# Patient Record
Sex: Male | Born: 1955
Health system: Southern US, Community
[De-identification: ages and names within clinical notes are randomized; demographics above are authoritative.]

## PROBLEM LIST (undated history)

## (undated) DIAGNOSIS — E785 Hyperlipidemia, unspecified: Secondary | ICD-10-CM

## (undated) DIAGNOSIS — E119 Type 2 diabetes mellitus without complications: Secondary | ICD-10-CM

## (undated) DIAGNOSIS — H269 Unspecified cataract: Secondary | ICD-10-CM

## (undated) DIAGNOSIS — F419 Anxiety disorder, unspecified: Secondary | ICD-10-CM

## (undated) DIAGNOSIS — M1990 Unspecified osteoarthritis, unspecified site: Secondary | ICD-10-CM

## (undated) DIAGNOSIS — I10 Essential (primary) hypertension: Secondary | ICD-10-CM

## (undated) HISTORY — DX: Unspecified cataract: H26.9

## (undated) HISTORY — DX: Hyperlipidemia, unspecified: E78.5

## (undated) HISTORY — PX: POLYPECTOMY: SHX149

## (undated) HISTORY — DX: Type 2 diabetes mellitus without complications: E11.9

## (undated) HISTORY — DX: Anxiety disorder, unspecified: F41.9

## (undated) HISTORY — DX: Essential (primary) hypertension: I10

## (undated) HISTORY — DX: Unspecified osteoarthritis, unspecified site: M19.90

## (undated) HISTORY — PX: TONSILLECTOMY: SUR1361

---

## 1999-01-07 ENCOUNTER — Other Ambulatory Visit: Admission: RE | Admit: 1999-01-07 | Discharge: 1999-01-07 | Payer: Self-pay | Admitting: Family Medicine

## 2006-11-05 HISTORY — PX: COLONOSCOPY: SHX174

## 2011-12-23 ENCOUNTER — Ambulatory Visit (AMBULATORY_SURGERY_CENTER): Payer: Self-pay | Admitting: *Deleted

## 2011-12-23 ENCOUNTER — Encounter: Payer: Self-pay | Admitting: Internal Medicine

## 2011-12-23 VITALS — Ht 73.0 in | Wt 257.5 lb

## 2011-12-23 DIAGNOSIS — Z8371 Family history of colonic polyps: Secondary | ICD-10-CM

## 2011-12-23 DIAGNOSIS — Z1211 Encounter for screening for malignant neoplasm of colon: Secondary | ICD-10-CM

## 2011-12-23 DIAGNOSIS — Z83719 Family history of colon polyps, unspecified: Secondary | ICD-10-CM

## 2011-12-23 MED ORDER — MOVIPREP 100 G PO SOLR: ORAL | Status: DC

## 2012-01-06 ENCOUNTER — Encounter: Payer: Self-pay | Admitting: Internal Medicine

## 2012-01-06 ENCOUNTER — Ambulatory Visit (AMBULATORY_SURGERY_CENTER): Payer: 59 | Admitting: Internal Medicine

## 2012-01-06 VITALS — BP 127/75 | HR 57 | Temp 97.2°F | Resp 11 | Ht 73.0 in | Wt 257.0 lb

## 2012-01-06 DIAGNOSIS — Z8371 Family history of colonic polyps: Secondary | ICD-10-CM

## 2012-01-06 DIAGNOSIS — Z83719 Family history of colon polyps, unspecified: Secondary | ICD-10-CM

## 2012-01-06 DIAGNOSIS — D126 Benign neoplasm of colon, unspecified: Secondary | ICD-10-CM

## 2012-01-06 DIAGNOSIS — Z1211 Encounter for screening for malignant neoplasm of colon: Secondary | ICD-10-CM

## 2012-01-06 MED ORDER — SODIUM CHLORIDE 0.9 % IV SOLN: 500.0000 mL | INTRAVENOUS | Status: DC

## 2012-01-06 NOTE — Patient Instructions (Addendum)
Discharge instructions given with verbal understanding. Handouts on polyps,diverticulosis and hemorrhoids given. Resume previous medications. YOU HAD AN ENDOSCOPIC PROCEDURE TODAY AT THE [facility]: Refer to the procedure report that was given to you for any specific questions about what was found during the examination.  If the procedure report does not answer your questions, please call your gastroenterologist to clarify.  If you requested that your care partner not be given the details of your procedure findings, then the procedure report has been included in a sealed envelope for you to review at your convenience later.  YOU SHOULD EXPECT: Some feelings of bloating in the abdomen. Passage of more gas than usual.  Walking can help get rid of the air that was put into your GI tract during the procedure and reduce the bloating. If you had a lower endoscopy (such as a colonoscopy or flexible sigmoidoscopy) you may notice spotting of blood in your stool or on the toilet paper. If you underwent a bowel prep for your procedure, then you may not have a normal bowel movement for a few days.  DIET: Your first meal following the procedure should be a light meal and then it is ok to progress to your normal diet.  A half-sandwich or bowl of soup is an example of a good first meal.  Heavy or fried foods are harder to digest and may make you feel nauseous or bloated.  Likewise meals heavy in dairy and vegetables can cause extra gas to form and this can also increase the bloating.  Drink plenty of fluids but you should avoid alcoholic beverages for 24 hours.  ACTIVITY: Your care partner should take you home directly after the procedure.  You should plan to take it easy, moving slowly for the rest of the day.  You can resume normal activity the day after the procedure however you should NOT DRIVE or use heavy machinery for 24 hours (because of the sedation medicines used during the test).    SYMPTOMS TO  REPORT IMMEDIATELY: A gastroenterologist can be reached at any hour.  During normal business hours, 8:30 AM to 5:00 PM Monday through Friday, call [phone].  After hours and on weekends, please call the GI answering service at [phone] who will take a message and have the physician on call contact you.   Following lower endoscopy (colonoscopy or flexible sigmoidoscopy):  Excessive amounts of blood in the stool  Significant tenderness or worsening of abdominal pains  Swelling of the abdomen that is new, acute  Fever of 100F or higher  FOLLOW UP: If any biopsies were taken you will be contacted by phone or by letter within the next 1-3 weeks.  Call your gastroenterologist if you have not heard about the biopsies in 3 weeks.  Our staff will call the home number listed on your records the next business day following your procedure to check on you and address any questions or concerns that you may have at that time regarding the information given to you following your procedure. This is a courtesy call and so if there is no answer at the home number and we have not heard from you through the emergency physician on call, we will assume that you have returned to your regular daily activities without incident.  SIGNATURES/CONFIDENTIALITY: You and/or your care partner have signed paperwork which will be entered into your electronic medical record.  These signatures attest to the fact that that the information above on your After Visit   Summary has been reviewed and is understood.  Full responsibility of the confidentiality of this discharge information lies with you and/or your care-partner.  

## 2012-01-06 NOTE — Progress Notes (Signed)
Patient did not experience any of the following events: a burn prior to discharge; a fall within the facility; wrong site/side/patient/procedure/implant event; or a hospital transfer or hospital admission upon discharge from the facility. (G8907) Patient did not have preoperative order for IV antibiotic SSI prophylaxis. (G8918)  

## 2012-01-06 NOTE — Progress Notes (Signed)
The pt tolerated the colonoscopy very well. Maw   

## 2012-01-06 NOTE — Progress Notes (Signed)
Propofol given over incremental dosages 

## 2012-01-06 NOTE — Op Note (Signed)
[facility] [address] [place], [address]   COLONOSCOPY PROCEDURE REPORT  PATIENT: Miguel Estrada, Miguel Estrada  MR#: [id number] BIRTHDATE: 21-Jul-1955 , 56  yrs. old GENDER: Male ENDOSCOPIST: Beverley Fiedler, MD REFERRED [id number] Tiburcio Pea, MD PROCEDURE DATE:  01/06/2012 PROCEDURE:   Colonoscopy with snare polypectomy ASA CLASS:   Class II INDICATIONS:elevated risk screening, Patient's family history of colon polyps, and Last colonoscopy performed 5 years ago. MEDICATIONS: MAC sedation, administered by CRNA and propofol (Diprivan) 300mg  IV  DESCRIPTION OF PROCEDURE:   After the risks benefits and alternatives of the procedure were thoroughly explained, informed consent was obtained.  A digital rectal exam revealed no rectal mass.   The LB CF-[id number]  endoscope was introduced through the anus and advanced to the terminal ileum which was intubated for a short distance. No adverse events experienced.   The quality of the prep was good, using MoviPrep  The instrument was then slowly withdrawn as the colon was fully examined.  COLON FINDINGS: The mucosa appeared normal in the terminal ileum. Three sessile polyps ranging between 3-29mm in size were found in the ascending colon and descending colon.  Polypectomy was performed using cold snare.  All resections were complete and all polyp tissue was completely retrieved.   Mild diverticulosis was noted in the sigmoid colon.  Retroflexed views revealed small internal hemorrhoids. The time to cecum=2 minutes 42 seconds. Withdrawal time=15 minutes 09 seconds.  The scope was withdrawn and the procedure completed. COMPLICATIONS: There were no complications.  ENDOSCOPIC IMPRESSION: 1.   Normal mucosa in the terminal ileum 2.   Three sessile polyps ranging between 3-70mm in size were found in the ascending colon and descending colon; Polypectomy was performed using cold snare 3.   Mild diverticulosis was noted in the sigmoid  colon 4.   Small internal hemorrhoids  RECOMMENDATIONS: 1.  Await pathology results 2.  High fiber diet 3.  Timing of repeat colonoscopy will be determined by pathology findings. 4.  You will receive a letter within 1-2 weeks with the results of your biopsy as well as final recommendations.  Please call my office if you have not received a letter after 3 weeks.   eSigned:  Beverley Fiedler, MD 01/06/2012 9:41 AM  cc: Johny Blamer MD and The Patient

## 2012-01-07 ENCOUNTER — Telehealth: Payer: Self-pay | Admitting: *Deleted

## 2012-01-07 NOTE — Telephone Encounter (Signed)
  Follow up Call-  Call back number 01/06/2012  Post procedure Call Back phone  # [phone]  Permission to leave phone message Yes     Patient questions:  Do you have a fever, pain , or abdominal swelling? no Pain Score  0 *  Have you tolerated food without any problems? yes  Have you been able to return to your normal activities? yes  Do you have any questions about your discharge instructions: Diet   no Medications  no Follow up visit  no  Do you have questions or concerns about your Care? no  Actions: * If pain score is 4 or above: No action needed, pain <4.

## 2012-01-11 ENCOUNTER — Encounter: Payer: Self-pay | Admitting: Internal Medicine

## 2012-09-03 ENCOUNTER — Ambulatory Visit: Payer: 59

## 2012-09-24 ENCOUNTER — Ambulatory Visit: Payer: 59

## 2012-10-11 ENCOUNTER — Encounter: Payer: 59 | Attending: Family Medicine

## 2012-10-11 VITALS — Ht 73.0 in | Wt 240.4 lb

## 2012-10-11 DIAGNOSIS — E119 Type 2 diabetes mellitus without complications: Secondary | ICD-10-CM | POA: Insufficient documentation

## 2012-10-11 DIAGNOSIS — Z713 Dietary counseling and surveillance: Secondary | ICD-10-CM | POA: Insufficient documentation

## 2012-10-15 NOTE — Progress Notes (Signed)
Patient was seen on 10/11/12 for the first of a series of three diabetes self-management courses at the [facility].   Current HbA1c: 6.4%  The following learning objectives were met by the patient during this course:   Defines the role of glucose and insulin  Identifies type of diabetes and pathophysiology  Defines the diagnostic criteria for diabetes and prediabetes  States the risk factors for Type 2 Diabetes  States the symptoms of Type 2 Diabetes  Defines Type 2 Diabetes treatment goals  Defines Type 2 Diabetes treatment options  States the rationale for glucose monitoring  Identifies A1C, glucose targets, and testing times  Identifies proper sharps disposal  Defines the purpose of a diabetes food plan  Identifies carbohydrate food groups  Defines effects of carbohydrate foods on glucose levels  Identifies carbohydrate choices/grams/food labels  States benefits of physical activity and effect on glucose  Review of suggested activity guidelines  Handouts given during class include:  Type 2 Diabetes: Basics Book  My Food Plan Book  Food and Activity Log  Your patient has identified their diabetes self-care support plan as:  [facility] support group  Continued diabetes education  Follow-Up Plan: Attend core 2 and core 3

## 2012-10-15 NOTE — Patient Instructions (Signed)
Goals:  Follow Diabetes Meal Plan as instructed  Eat 3 meals and 2 snacks, every 3-5 hrs  Limit carbohydrate intake to 30-45 grams carbohydrate/meal  Limit carbohydrate intake to 0-15 grams carbohydrate/snack  Add lean protein foods to meals/snacks  Monitor glucose levels as instructed by your doctor  Aim for 15-30 mins of physical activity daily  Bring food record and glucose log to your next nutrition visit   

## 2012-10-22 ENCOUNTER — Ambulatory Visit: Payer: 59

## 2012-11-24 ENCOUNTER — Other Ambulatory Visit: Payer: Self-pay | Admitting: Family Medicine

## 2012-11-29 ENCOUNTER — Encounter: Payer: 59 | Attending: Family Medicine

## 2012-11-29 DIAGNOSIS — Z713 Dietary counseling and surveillance: Secondary | ICD-10-CM | POA: Insufficient documentation

## 2012-11-29 DIAGNOSIS — E119 Type 2 diabetes mellitus without complications: Secondary | ICD-10-CM | POA: Insufficient documentation

## 2012-12-27 ENCOUNTER — Encounter: Payer: 59 | Attending: Family Medicine

## 2012-12-27 DIAGNOSIS — Z713 Dietary counseling and surveillance: Secondary | ICD-10-CM | POA: Insufficient documentation

## 2012-12-27 DIAGNOSIS — E119 Type 2 diabetes mellitus without complications: Secondary | ICD-10-CM

## 2012-12-28 NOTE — Progress Notes (Signed)
Patient was seen on 12/27/12 for the third of a series of three diabetes self-management courses at the [facility]. The following learning objectives were met by the patient during this class:    State the amount of activity recommended for healthy living   Describe activities suitable for individual needs   Identify ways to regularly incorporate activity into daily life   Identify barriers to activity and ways to over come these barriers  Identify diabetes medications being personally used and their primary action for lowering glucose and possible side effects   Describe role of stress on blood glucose and develop strategies to address psychosocial issues   Identify diabetes complications and ways to prevent them  Explain how to manage diabetes during illness   Evaluate success in meeting personal goal   Establish 2-3 goals that they will plan to diligently work on until they return for the free 16-month follow-up visit  Your patient has established the following 4 month goals in their individualized success plan: I will increase my activity level at least 3 days a week for 30 minutes or more I will take my diabetes medications as scheduled To help manage stress I will do walking at least 3 times a week  Your patient has identified these potential barriers to change:  There are no potential barriers, I will accomplish this goal and manage my diabetes successfully  Your patient has identified their diabetes self-care support plan as  My family, education

## 2013-04-12 ENCOUNTER — Encounter: Payer: Self-pay | Admitting: Neurology

## 2013-04-12 ENCOUNTER — Ambulatory Visit (INDEPENDENT_AMBULATORY_CARE_PROVIDER_SITE_OTHER): Payer: 59 | Admitting: Neurology

## 2013-04-12 VITALS — BP 140/80 | HR 96 | Resp 18 | Ht 72.0 in | Wt 243.0 lb

## 2013-04-12 DIAGNOSIS — R42 Dizziness and giddiness: Secondary | ICD-10-CM

## 2013-04-12 DIAGNOSIS — R200 Anesthesia of skin: Secondary | ICD-10-CM

## 2013-04-12 DIAGNOSIS — R209 Unspecified disturbances of skin sensation: Secondary | ICD-10-CM

## 2013-04-12 NOTE — Patient Instructions (Addendum)
1.  We will check an MRI of the brain to look for anything unusual.  It probably is due to inner ear dysfunction, however.  We will contact you with the results.  If there is anything on the MRI, we will contact you and schedule follow up with me. We have scheduled you at [facility] for your MRI on 04/22/2013 at 10:00 am. Please arrive 30 minutes prior and go to [place].

## 2013-04-12 NOTE — Progress Notes (Addendum)
NEUROLOGY CONSULTATION NOTE  Miguel Estrada MRN: [id number] DOB: [birth year]  Referring provider: Dr. Moreen Fowler Primary care provider: Dr. Kenton Kingfisher  Reason for consult:  Dysequilibrium  HISTORY OF PRESENT ILLNESS: Miguel Estrada is a 58 year old right-handed man with diabetes mellitus type II, hypertension, hyperlipidemia,  BPH and anxiety who presents for dizziness.  Records and images were personally reviewed where available.    He has had symptoms for 10 years, but thinks it has been getting worse over the past few months.  He describes feeling of swaying to either side while he walks.  This doesn't just happen when he first gets up.  He has stumbled at times but hasn't fallen.  It is not associated with any spinning sensation or double vision.  There is no associated unilateral weakness, slurred speech, headache, unilateral numbness.  He denies hearing loss or tinnitus.  He reports that meclizine exacerbated the symptoms.  He also has episodes of lightheadedness, particularly when he gets up.  Sometimes he feels like he is going to pass out.  He saw audiology in 2009, who told him that he had a vestibulopathy, perhaps an inner ear congestion, worse on the right side.  He was taught to perform vestibular rehab therapy, but he apparently was not performing it correctly and he subsequently stopped.  He reportedly saw ENT (Dr. Lucia Gaskins) last year, who thought it was an inner ear problem and recommended vestibular rehab, which was not pursued.  Unfortunately, I do not have the ENT's notes so I do not have the details of testing that was performed.  More recently, he complains of bilateral numbness and tingling of the face, associated with flushing of his cheeks.  This sensation has become more constant.  He also feels that he is dropping things more frequently.  He also endorses a muscle cramp in his right hamstring.  He does have history of anxiety.  PAST MEDICAL HISTORY: Past Medical History  Diagnosis  Date  . Cataract     left eye  . Hyperlipidemia   . Hypertension   . Diabetes mellitus without complication   . Anxiety   . Arthritis     neck    PAST SURGICAL HISTORY: Past Surgical History  Procedure Laterality Date  . Tonsillectomy  age 52  . Colonoscopy  11-05-2006    Dr.Buccini-normal colon    MEDICATIONS: Current Outpatient Prescriptions on File Prior to Visit  Medication Sig Dispense Refill  . BABY ASPIRIN PO Take 81 mg by mouth daily.      Marland Kitchen LORazepam (ATIVAN) 0.5 MG tablet Take 0.5 mg by mouth as needed for anxiety.      Marland Kitchen losartan (COZAAR) 50 MG tablet Take 50 mg by mouth daily.      . metFORMIN (GLUCOPHAGE) 1000 MG tablet Take 1,000 mg by mouth daily with breakfast.      . pravastatin (PRAVACHOL) 80 MG tablet Take 80 mg by mouth daily.       No current facility-administered medications on file prior to visit.    ALLERGIES: Allergies  Allergen Reactions  . Penicillins Other (See Comments)    As child=reaction unknown    FAMILY HISTORY: Family History  Problem Relation Age of Onset  . Colon polyps Brother   . Colon cancer Neg Hx     SOCIAL HISTORY: History   Social History  . Marital Status: Unknown    Spouse Name: N/A    Number of Children: N/A  . Years of Education: N/A  Occupational History  . Not on file.   Social History Main Topics  . Smoking status: Former Research scientist (life sciences)  . Smokeless tobacco: Never Used     Comment: quit smoking 30 years ago  . Alcohol Use: 1.2 oz/week    2 Cans of beer per week  . Drug Use: No  . Sexual Activity: Not on file   Other Topics Concern  . Not on file   Social History Narrative  . No narrative on file    REVIEW OF SYSTEMS: Constitutional: No fevers, chills, or sweats, no generalized fatigue, change in appetite Eyes: No visual changes, double vision, eye pain Ear, nose and throat: As above. Cardiovascular: No chest pain, palpitations Respiratory:  No shortness of breath at rest or with exertion,  wheezes GastrointestinaI: No nausea, vomiting, diarrhea, abdominal pain, fecal incontinence Genitourinary:  No dysuria, urinary retention or frequency Musculoskeletal:  Right hamstring pain. Integumentary: No rash, pruritus, skin lesions Neurological: as above Psychiatric: No depression, insomnia, anxiety Endocrine: No palpitations, fatigue, diaphoresis, mood swings, change in appetite, change in weight, increased thirst Hematologic/Lymphatic:  No anemia, purpura, petechiae. Allergic/Immunologic: no itchy/runny eyes, nasal congestion, recent allergic reactions, rashes  PHYSICAL EXAM: Filed Vitals:   04/12/13 1454  BP: 140/80  Pulse: 96  Resp: 18   General: No acute distress Head:  Normocephalic/atraumatic Neck: supple, no paraspinal tenderness, full range of motion Back: No paraspinal tenderness Heart: regular rate and rhythm Lungs: Clear to auscultation bilaterally. Vascular: No carotid bruits. Neurological Exam: Mental status: alert and oriented to person, place, and time, speech fluent and not dysarthric, language intact. Cranial nerves: CN I: not tested CN II: pupils equal, round and reactive to light, visual fields intact, fundi unremarkable. CN III, IV, VI:  full range of motion, mild right horizontal nystagmus, no ptosis CN V: facial sensation intact CN VII: upper and lower face symmetric CN VIII: hearing intact CN IX, X: gag intact, uvula midline CN XI: sternocleidomastoid and trapezius muscles intact CN XII: tongue midline Bulk & Tone: normal, no fasciculations. Motor: 5/5 throughout Sensation: temperature and vibration intact. Deep Tendon Reflexes: 2+ throughout, toes down Finger to nose testing: no dysmetria Heel to shin: no dysmetria Gait: normal stance and stride.  Able to turn.  Able to walk in tandem but cautiously. Romberg negative.  IMPRESSION: Dysequilibrium.  Facial numbness  I suspect a peripheral vestibulopathy, however since he also endorses  numbness of the face, we will get MRI of brain.  PLAN: MRI brain  60 minutes spent with patient, over 50% spent reviewing notes, counseling and coordinating care. Thank you for allowing me to take part in the care of this patient.  Metta Clines, DO  CC:  Shirline Frees, MD  Antony Contras, MD

## 2013-04-13 ENCOUNTER — Ambulatory Visit: Payer: 59 | Admitting: Neurology

## 2013-04-22 ENCOUNTER — Ambulatory Visit
Admission: RE | Admit: 2013-04-22 | Discharge: 2013-04-22 | Disposition: A | Discharge status: Home / Self Care | Payer: 59 | Source: Ambulatory Visit | Attending: Neurology | Admitting: Neurology

## 2013-04-22 DIAGNOSIS — R42 Dizziness and giddiness: Secondary | ICD-10-CM

## 2013-04-26 ENCOUNTER — Telehealth: Payer: Self-pay | Admitting: Neurology

## 2013-04-26 DIAGNOSIS — R42 Dizziness and giddiness: Secondary | ICD-10-CM

## 2013-04-26 NOTE — Telephone Encounter (Signed)
Please advise on MRI  Of brain

## 2013-04-26 NOTE — Telephone Encounter (Signed)
That would be fine 

## 2013-04-26 NOTE — Telephone Encounter (Signed)
Pt would like a referral [facility] the telephone number is [phone] the fax number [phone] the patient phone number is [phone]

## 2013-04-26 NOTE — Telephone Encounter (Signed)
Pt calling for MRI results, please call [phone] / Sherri S.

## 2013-04-26 NOTE — Telephone Encounter (Signed)
Patient is asking if Dr Tomi Likens thinks out patient rehab would help with his balance ?Please advise

## 2013-04-27 ENCOUNTER — Telehealth: Payer: Self-pay | Admitting: *Deleted

## 2013-04-27 NOTE — Telephone Encounter (Signed)
Left message for  appt for neuro rehab out patient  They will contact him with appt

## 2013-04-28 ENCOUNTER — Ambulatory Visit: Payer: 59 | Attending: Neurology | Admitting: Physical Therapy

## 2013-04-28 DIAGNOSIS — IMO0001 Reserved for inherently not codable concepts without codable children: Secondary | ICD-10-CM | POA: Insufficient documentation

## 2013-04-28 DIAGNOSIS — R42 Dizziness and giddiness: Secondary | ICD-10-CM | POA: Insufficient documentation

## 2013-04-28 DIAGNOSIS — R269 Unspecified abnormalities of gait and mobility: Secondary | ICD-10-CM | POA: Insufficient documentation

## 2013-05-01 ENCOUNTER — Ambulatory Visit: Payer: 59 | Admitting: Rehabilitative and Restorative Service Providers"

## 2013-05-02 ENCOUNTER — Ambulatory Visit: Payer: 59 | Admitting: *Deleted

## 2013-05-10 ENCOUNTER — Ambulatory Visit: Payer: 59 | Admitting: Physical Therapy

## 2013-05-15 ENCOUNTER — Ambulatory Visit: Payer: 59 | Admitting: Physical Therapy

## 2013-05-24 ENCOUNTER — Encounter: Payer: 59 | Admitting: Physical Therapy

## 2013-06-04 ENCOUNTER — Emergency Department (HOSPITAL_COMMUNITY)
Admission: EM | Admit: 2013-06-04 | Discharge: 2013-06-04 | Disposition: A | Discharge status: Home / Self Care | Payer: 59 | Source: Home / Self Care | Attending: Emergency Medicine | Admitting: Emergency Medicine

## 2013-06-04 ENCOUNTER — Encounter (HOSPITAL_COMMUNITY): Payer: Self-pay | Admitting: Emergency Medicine

## 2013-06-04 DIAGNOSIS — S20219A Contusion of unspecified front wall of thorax, initial encounter: Secondary | ICD-10-CM

## 2013-06-04 DIAGNOSIS — S20212A Contusion of left front wall of thorax, initial encounter: Secondary | ICD-10-CM

## 2013-06-04 DIAGNOSIS — X58XXXA Exposure to other specified factors, initial encounter: Secondary | ICD-10-CM

## 2013-06-04 DIAGNOSIS — T148XXA Other injury of unspecified body region, initial encounter: Secondary | ICD-10-CM

## 2013-06-04 MED ORDER — CYCLOBENZAPRINE HCL 5 MG PO TABS: 5.0000 mg | ORAL_TABLET | Freq: Three times a day (TID) | ORAL | Status: DC | PRN

## 2013-06-04 NOTE — ED Provider Notes (Signed)
CSN: [id number]     Arrival date & time 06/04/13  1417 History   First MD Initiated Contact with Patient 06/04/13 1553     Chief Complaint  Patient presents with  . Rib Injury   (Consider location/radiation/quality/duration/timing/severity/associated sxs/prior Treatment) HPI Comments: Pt was lying on L side trying to reach for something in storm drain yesterday, now L lower side ribs hurt. Pain not constant, mostly when takes deep breath or moves torso. Also c/o R posterior shoulder muscle tightness for a long time.   Patient is a 58 y.o. male presenting with chest pain. The history is provided by the patient.  Chest Pain Pain location:  L lateral chest Pain quality: aching   Pain radiates to:  Does not radiate Pain radiates to the back: no   Pain severity:  Mild Onset quality:  Gradual Duration:  1 day Timing:  Intermittent Progression:  Unchanged Chronicity:  New Context: trauma   Relieved by:  Nothing Worsened by:  Movement Ineffective treatments: ibuprofen. Associated symptoms: no fever and no shortness of breath     Past Medical History  Diagnosis Date  . Cataract     left eye  . Hyperlipidemia   . Hypertension   . Diabetes mellitus without complication   . Anxiety   . Arthritis     neck   Past Surgical History  Procedure Laterality Date  . Tonsillectomy  age 45  . Colonoscopy  11-05-2006    Dr.Buccini-normal colon   Family History  Problem Relation Age of Onset  . Colon polyps Brother   . Colon cancer Neg Hx    History  Substance Use Topics  . Smoking status: Former Research scientist (life sciences)  . Smokeless tobacco: Never Used     Comment: quit smoking 30 years ago  . Alcohol Use: 1.2 oz/week    2 Cans of beer per week    Review of Systems  Constitutional: Negative for fever and chills.  Respiratory: Negative for shortness of breath.   Cardiovascular: Positive for chest pain.  Musculoskeletal:       Rib pain, muscle tightness    Allergies  Penicillins  Home  Medications   Prior to Admission medications   Medication Sig Start Date End Date Taking? Authorizing Provider  BABY ASPIRIN PO Take 81 mg by mouth daily.   Yes Historical Provider, MD  losartan (COZAAR) 50 MG tablet Take 50 mg by mouth daily.   Yes Historical Provider, MD  metFORMIN (GLUCOPHAGE) 1000 MG tablet Take 1,000 mg by mouth daily with breakfast.   Yes Historical Provider, MD  pravastatin (PRAVACHOL) 80 MG tablet Take 80 mg by mouth daily.   Yes Historical Provider, MD  cyclobenzaprine (FLEXERIL) 5 MG tablet Take 1 tablet (5 mg total) by mouth 3 (three) times daily as needed for muscle spasms. 06/04/13   Carvel Getting, NP  LORazepam (ATIVAN) 0.5 MG tablet Take 0.5 mg by mouth as needed for anxiety.    Historical Provider, MD  Saw Palmetto, Serenoa repens, (SAW PALMETTO PO) Take by mouth daily.    Historical Provider, MD   BP 137/80  Pulse 84  Temp(Src) 98.6 F (37 C) (Oral)  SpO2 97% Physical Exam  Constitutional: He appears well-developed and well-nourished. No distress.  Cardiovascular: Normal rate and regular rhythm.   Pulmonary/Chest: Effort normal and breath sounds normal. He exhibits tenderness. He exhibits no bony tenderness.    Musculoskeletal:       Left shoulder: He exhibits tenderness. He exhibits normal range of motion  and no bony tenderness.       Arms: Skin: Skin is warm and dry.    ED Course  Procedures (including critical care time) Labs Review Labs Reviewed - No data to display  No results found for this or any previous visit. Imaging Review No results found.   MDM   1. Contusion of rib on left side   2. Muscle strain   pt to take own naproxen at home, rx flexeril 5mg  TID prn #30.    Carvel Getting, NP 06/04/13 [phone]

## 2013-06-04 NOTE — ED Notes (Signed)
Pt comes in with c/o left side rib pain with movement or deep inspiration after lying on cement ground yesterday. States he tried to get something out sewer. No injury noted,scratched to left arm noted. Denies SOB or CP. Pt tried ice/Ibuprofen for relief

## 2013-06-04 NOTE — Discharge Instructions (Signed)
Use your ibuprofen or naproxen to help manage your pain (do not take them both together).    Muscle Strain A muscle strain (pulled muscle) happens when a muscle is stretched beyond normal length. It happens when a sudden, violent force stretches your muscle too far. Usually, a few of the fibers in your muscle are torn. Muscle strain is common in athletes. Recovery usually takes 1 2 weeks. Complete healing takes 5 6 weeks.  HOME CARE   Follow the PRICE method of treatment to help your injury get better. Do this the first 2 3 days after the injury:  Protect. Protect the muscle to keep it from getting injured again.  Rest. Limit your activity and rest the injured body part.  Ice. Put ice in a plastic bag. Place a towel between your skin and the bag. Then, apply the ice and leave it on from 15 20 minutes each hour. After the third day, switch to moist heat packs.  Compression. Use a splint or elastic bandage on the injured area for comfort. Do not put it on too tightly.  Elevate. Keep the injured body part above the level of your heart.  Only take medicine as told by your doctor.  Warm up before doing exercise to prevent future muscle strains. GET HELP IF:   You have more pain or puffiness (swelling) in the injured area.  You feel numbness, tingling, or notice a loss of strength in the injured area. MAKE SURE YOU:   Understand these instructions.  Will watch your condition.  Will get help right away if you are not doing well or get worse. Document Released: 11/12/2007 Document Revised: 11/23/2012 Document Reviewed: 09/01/2012 [facility] Patient Information 2014 [place], [place].

## 2013-06-05 NOTE — ED Provider Notes (Signed)
Medical screening examination/treatment/procedure(s) were performed by non-physician practitioner and as supervising physician I was immediately available for consultation/collaboration.  Philipp Deputy, M.D.  Harden Mo, MD 06/05/13 [id number]

## 2015-03-26 MED FILL — VIAGRA 100 MG TABLET: 100 | 30 days supply | Qty: 6 | Fill #0

## 2015-03-29 MED FILL — TAMSULOSIN HCL 0.4 MG CAP: 0.4 | 90 days supply | Qty: 90 | Fill #0

## 2015-04-23 MED FILL — LOSARTAN POTASSIUM 50 MG TA: 50 | 90 days supply | Qty: 90 | Fill #1

## 2015-04-23 MED FILL — METFORMIN HCL ER 500 MG TAB: 500 | 30 days supply | Qty: 60 | Fill #1

## 2015-04-23 MED FILL — TRUE METRIX GLUCOSE TEST ST: 25 days supply | Qty: 100 | Fill #2

## 2015-06-19 MED FILL — METFORMIN HCL ER 500 MG TAB: 500 | 30 days supply | Qty: 60 | Fill #2

## 2015-07-30 MED FILL — METFORMIN HCL ER 500 MG TAB: 500 | 30 days supply | Qty: 60 | Fill #3

## 2015-08-01 MED FILL — LORazepam 0.5 MG TABS: 0.5 | 15 days supply | Qty: 30 | Fill #0

## 2015-08-13 MED FILL — TRUE METRIX GLUCOSE TEST ST: 25 days supply | Qty: 100 | Fill #0

## 2015-09-02 MED FILL — LOSARTAN POTASSIUM 50 MG TA: 50 | 90 days supply | Qty: 90 | Fill #0

## 2015-09-05 MED FILL — TAMSULOSIN HCL 0.4 MG CAP: 0.4 | 90 days supply | Qty: 90 | Fill #0

## 2015-09-11 MED FILL — METFORMIN HCL ER 500 MG TAB: 500 | 90 days supply | Qty: 180 | Fill #0

## 2015-09-16 DIAGNOSIS — E119 Type 2 diabetes mellitus without complications: Secondary | ICD-10-CM | POA: Diagnosis not present

## 2015-09-16 DIAGNOSIS — N400 Enlarged prostate without lower urinary tract symptoms: Secondary | ICD-10-CM | POA: Diagnosis not present

## 2015-09-16 DIAGNOSIS — E785 Hyperlipidemia, unspecified: Secondary | ICD-10-CM | POA: Diagnosis not present

## 2015-09-16 DIAGNOSIS — I10 Essential (primary) hypertension: Secondary | ICD-10-CM | POA: Diagnosis not present

## 2015-09-16 DIAGNOSIS — N529 Male erectile dysfunction, unspecified: Secondary | ICD-10-CM | POA: Diagnosis not present

## 2015-09-17 MED FILL — PRAVASTATIN SODIUM 80 MG TA: 80 | 30 days supply | Qty: 30 | Fill #0

## 2015-09-19 MED FILL — LORazepam 0.5 MG TABS: 0.5 | 15 days supply | Qty: 30 | Fill #0

## 2015-09-20 MED FILL — SILDENAFIL 20 MG TABLET: 20 | 10 days supply | Qty: 50 | Fill #0

## 2015-10-24 MED FILL — DICLOFENAC SODIUM 1% GEL: 1 | 25 days supply | Qty: 100 | Fill #0

## 2015-12-06 MED FILL — LORazepam 0.5 MG TABS: 0.5 | 15 days supply | Qty: 30 | Fill #0

## 2015-12-25 MED FILL — LOSARTAN POTASSIUM 50 MG TA: 50 | 90 days supply | Qty: 90 | Fill #0

## 2016-01-16 IMAGING — MR MR HEAD W/O CM
9 of 10 series · 39 of 48 positions shown · non-contrast
Comparison: None.

CLINICAL DATA: Dizziness. Blurred vision and facial numbness. Left
leg weakness.

EXAM:
MRI HEAD WITHOUT CONTRAST
TECHNIQUE: Multiplanar, multiecho pulse sequences of the brain and surrounding
structures were obtained without intravenous contrast.

[Series 3: T1 · sagittal · 5.0mm · 0.45mm/px · 3 of 19 slices shown]
[im 1/19]
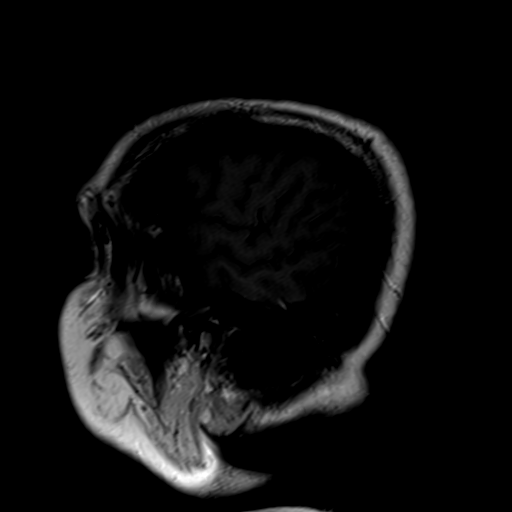
[im 10/19]
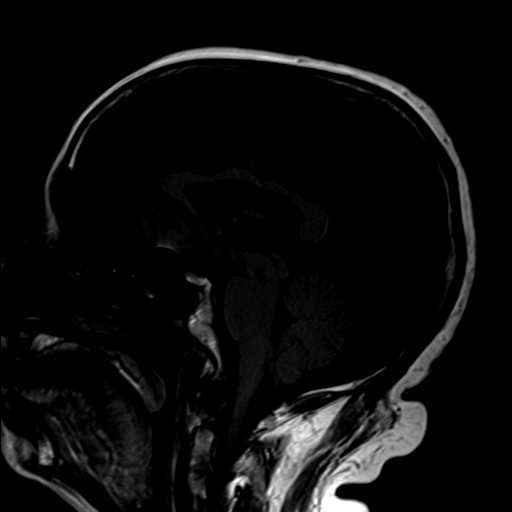
[im 19/19]
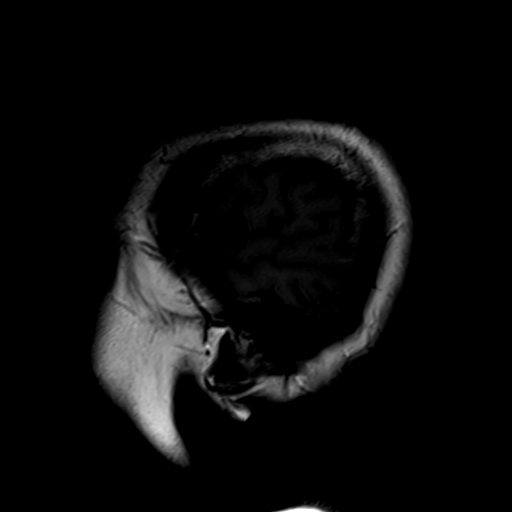

[Series 4: DWI · axial · 5.0mm · 1.80mm/px · z∈[-63,+77]mm · 6 of 44 slices shown (1 of 4)]
[im 1/44]
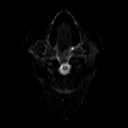
[im 9/44]
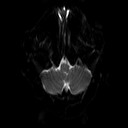
[im 18/44]
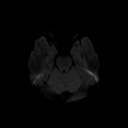
[im 26/44]
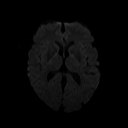
[im 35/44]
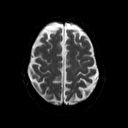
[im 44/44]
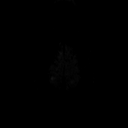

[Series 5: DWI · axial · 5.0mm · 1.80mm/px · z∈[-63,+77]mm · 3 of 22 slices shown (2 of 4)]
[im 1/22]
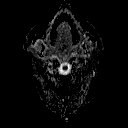
[im 11/22]
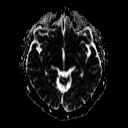
[im 22/22]
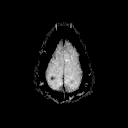

[Series 6: DWI · coronal · 5.0mm · 1.80mm/px · 6 of 48 slices shown (3 of 4)]
[im 1/48]
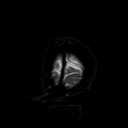
[im 10/48]
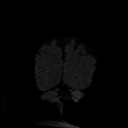
[im 19/48]
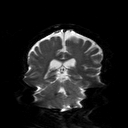
[im 29/48]
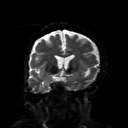
[im 38/48]
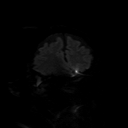
[im 48/48]
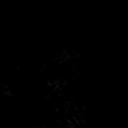

[Series 7: DWI · coronal · 5.0mm · 1.80mm/px · 3 of 24 slices shown (4 of 4)]
[im 1/24]
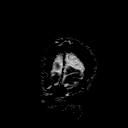
[im 12/24]
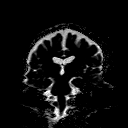
[im 24/24]
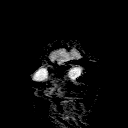

[Series 9: swi_images · axial · 2.0mm · 0.90mm/px · z∈[-60,+75]mm · 9 of 72 slices shown]
[im 1/72]
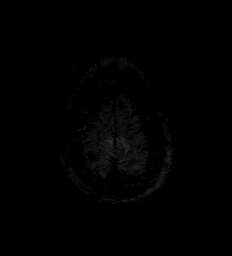
[im 9/72]
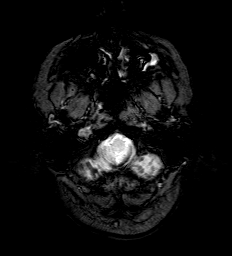
[im 18/72]
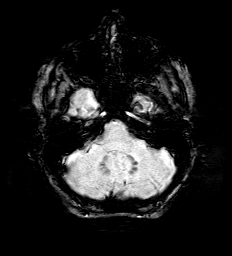
[im 27/72]
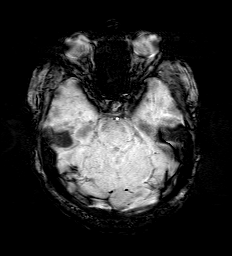
[im 36/72]
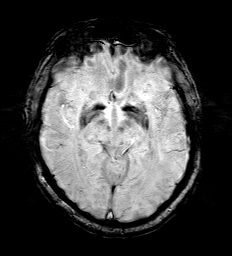
[im 45/72]
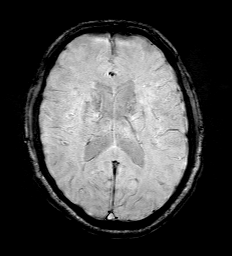
[im 54/72]
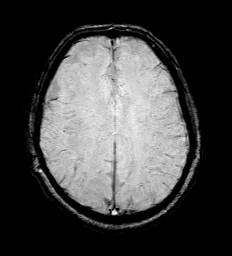
[im 63/72]
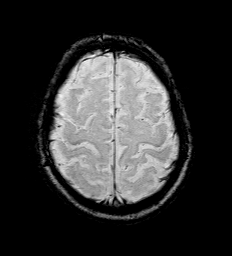
[im 72/72]
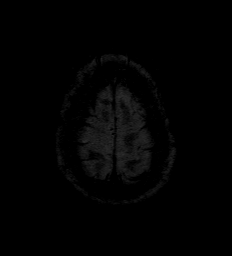

[Series 10: T2 · axial · 5.0mm · 0.30mm/px · z∈[-62,+78]mm · 3 of 22 slices shown (1 of 2)]
[im 1/22]
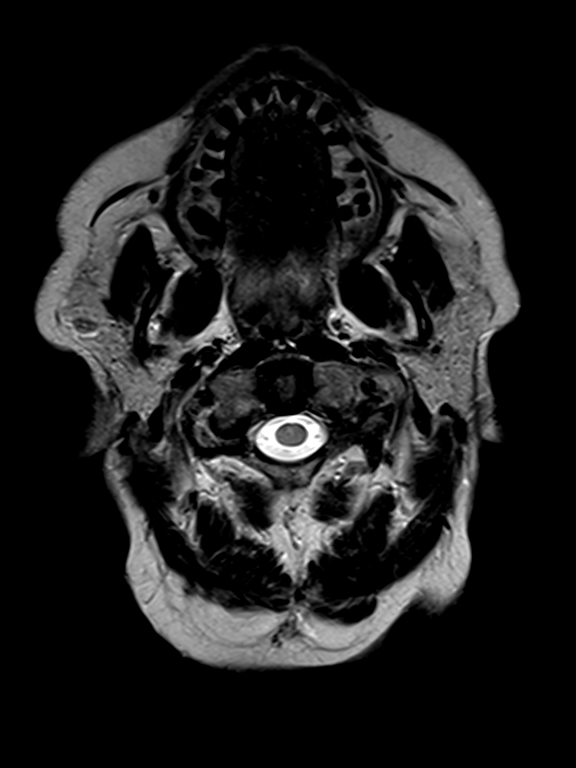
[im 11/22]
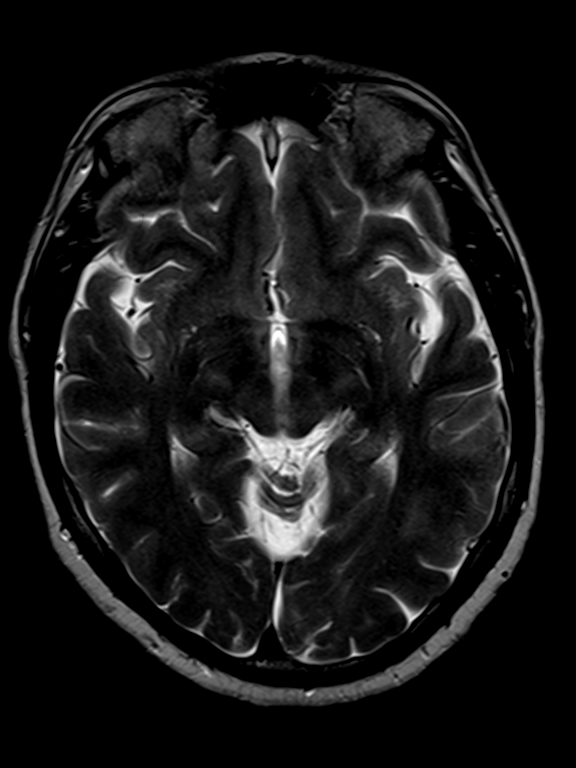
[im 22/22]
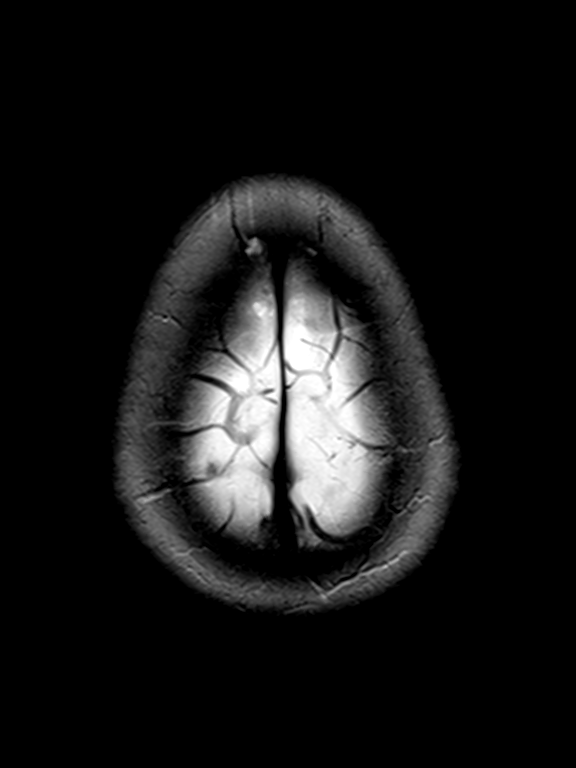

[Series 11: FLAIR · axial · 5.0mm · 0.45mm/px · z∈[-63,+77]mm · 3 of 22 slices shown]
[im 1/22]
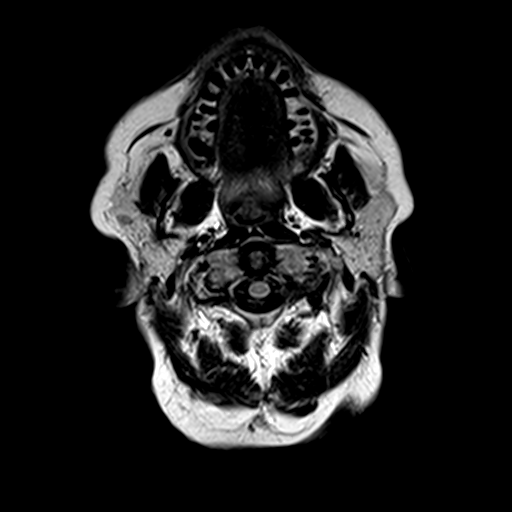
[im 11/22]
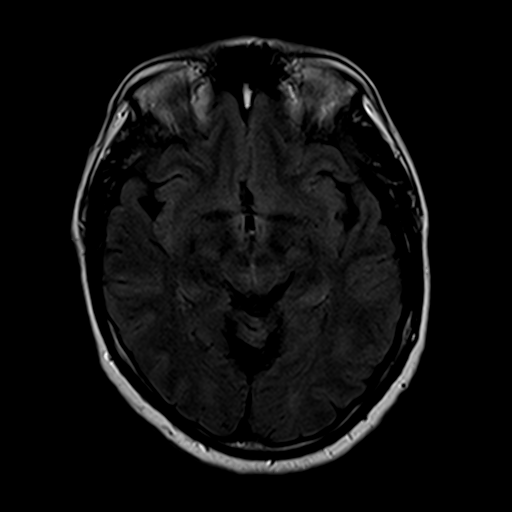
[im 22/22]
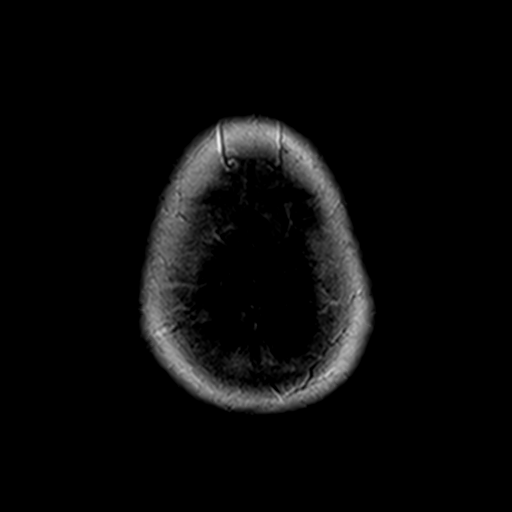

[Series 13: T2 · coronal · 5.0mm · 0.41mm/px · 3 of 22 slices shown (2 of 2)]
[im 1/22]
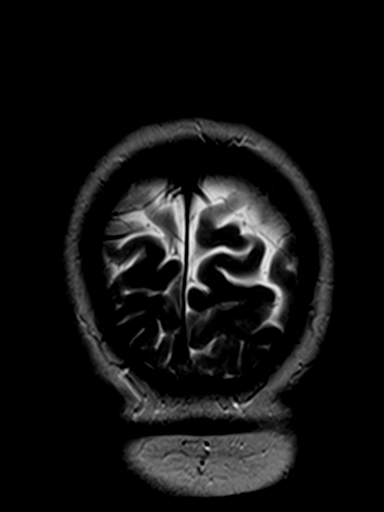
[im 11/22]
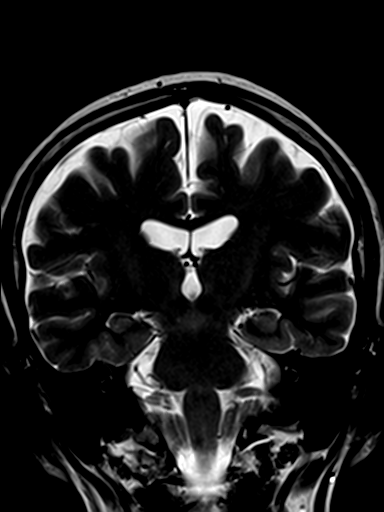
[im 22/22]
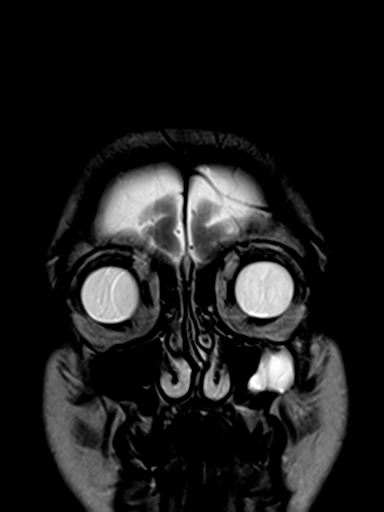

[39 of 48 positions shown; findings below may reference images not displayed]

FINDINGS: There is no acute infarct or intracranial hemorrhage. There is
moderate cerebral atrophy, advanced for age. There is no evidence of
significant white matter disease. Incidental note is made of a mega
cisterna magna. There is no evidence of mass, midline shift, or
extra-axial fluid collection. Orbits are unremarkable. Bilateral
maxillary sinus mucous retention cysts are noted. Mastoid air cells
are clear. Major intracranial vascular flow voids are preserved.
IMPRESSION: 1. No evidence of acute intracranial abnormality or mass.
2. Moderate cerebral atrophy, advanced for age.

## 2016-01-17 MED FILL — LORazepam 0.5 MG TABS: 0.5 | 30 days supply | Qty: 60 | Fill #0

## 2016-01-27 MED FILL — METFORMIN HCL ER 500 MG TAB: 500 | 90 days supply | Qty: 180 | Fill #0

## 2016-01-27 MED FILL — PRAVASTATIN SODIUM 80 MG TA: 80 | 30 days supply | Qty: 30 | Fill #1

## 2016-02-12 MED FILL — SILDENAFIL 100 MG TABLET: 100 | 90 days supply | Qty: 18 | Fill #1

## 2016-03-16 DIAGNOSIS — M47812 Spondylosis without myelopathy or radiculopathy, cervical region: Secondary | ICD-10-CM | POA: Diagnosis not present

## 2016-03-16 DIAGNOSIS — I10 Essential (primary) hypertension: Secondary | ICD-10-CM | POA: Diagnosis not present

## 2016-03-16 DIAGNOSIS — N400 Enlarged prostate without lower urinary tract symptoms: Secondary | ICD-10-CM | POA: Diagnosis not present

## 2016-03-16 DIAGNOSIS — Z125 Encounter for screening for malignant neoplasm of prostate: Secondary | ICD-10-CM | POA: Diagnosis not present

## 2016-03-16 DIAGNOSIS — E7800 Pure hypercholesterolemia, unspecified: Secondary | ICD-10-CM | POA: Diagnosis not present

## 2016-03-16 DIAGNOSIS — E119 Type 2 diabetes mellitus without complications: Secondary | ICD-10-CM | POA: Diagnosis not present

## 2016-03-16 DIAGNOSIS — F419 Anxiety disorder, unspecified: Secondary | ICD-10-CM | POA: Diagnosis not present

## 2016-03-16 MED FILL — LORazepam 0.5 MG TABS: 0.5 | 30 days supply | Qty: 60 | Fill #0

## 2016-03-16 MED FILL — GABAPENTIN 300 MG CAPSULE: 300 | 30 days supply | Qty: 30 | Fill #0

## 2016-04-22 MED FILL — LOSARTAN POTASSIUM 50 MG TA: 50 | 90 days supply | Qty: 90 | Fill #1

## 2016-05-04 MED FILL — LORazepam 0.5 MG TABS: 0.5 | 30 days supply | Qty: 60 | Fill #1

## 2016-05-04 MED FILL — NAPROXEN 500 MG TABLET: 500 | 30 days supply | Qty: 60 | Fill #0

## 2016-05-26 MED FILL — METFORMIN HCL ER 500 MG TAB: 500 | 90 days supply | Qty: 180 | Fill #1

## 2016-05-26 MED FILL — PRAVASTATIN SODIUM 80 MG TA: 80 | 30 days supply | Qty: 30 | Fill #2

## 2016-06-30 MED FILL — LORazepam 0.5 MG TABS: 0.5 | 30 days supply | Qty: 60 | Fill #1

## 2016-07-29 MED FILL — LOSARTAN POTASSIUM 50 MG TA: 50 | 90 days supply | Qty: 90 | Fill #0

## 2016-08-24 MED FILL — LORazepam 0.5 MG TABS: 0.5 | 30 days supply | Qty: 60 | Fill #0

## 2016-10-08 MED FILL — SILDENAFIL CITRATE 100 MG T: 100 | 90 days supply | Qty: 18 | Fill #0

## 2016-10-08 MED FILL — TAMSULOSIN HCL 0.4 MG CAP: 0.4 | 90 days supply | Qty: 90 | Fill #0

## 2016-10-08 MED FILL — METFORMIN HCL ER 500 MG TAB: 500 | 90 days supply | Qty: 180 | Fill #0

## 2016-11-11 MED FILL — LOSARTAN POTASSIUM 50 MG TA: 50 | 90 days supply | Qty: 90 | Fill #0

## 2016-12-02 ENCOUNTER — Emergency Department (HOSPITAL_COMMUNITY)
Admission: EM | Admit: 2016-12-02 | Discharge: 2016-12-02 | Disposition: A | Discharge status: Home / Self Care | Payer: 59 | Attending: Physician Assistant | Admitting: Physician Assistant

## 2016-12-02 ENCOUNTER — Encounter (HOSPITAL_COMMUNITY): Payer: Self-pay | Admitting: Emergency Medicine

## 2016-12-02 ENCOUNTER — Emergency Department (HOSPITAL_COMMUNITY): Payer: 59

## 2016-12-02 DIAGNOSIS — I10 Essential (primary) hypertension: Secondary | ICD-10-CM | POA: Insufficient documentation

## 2016-12-02 DIAGNOSIS — R079 Chest pain, unspecified: Secondary | ICD-10-CM | POA: Diagnosis not present

## 2016-12-02 DIAGNOSIS — Z87891 Personal history of nicotine dependence: Secondary | ICD-10-CM | POA: Diagnosis not present

## 2016-12-02 DIAGNOSIS — R0789 Other chest pain: Secondary | ICD-10-CM | POA: Insufficient documentation

## 2016-12-02 DIAGNOSIS — Z7984 Long term (current) use of oral hypoglycemic drugs: Secondary | ICD-10-CM | POA: Diagnosis not present

## 2016-12-02 DIAGNOSIS — E119 Type 2 diabetes mellitus without complications: Secondary | ICD-10-CM | POA: Insufficient documentation

## 2016-12-02 DIAGNOSIS — Z79899 Other long term (current) drug therapy: Secondary | ICD-10-CM | POA: Insufficient documentation

## 2016-12-02 LAB — BASIC METABOLIC PANEL
ANION GAP: 8 (ref 5–15)
BUN: 15 mg/dL (ref 6–20)
CHLORIDE: 100 mmol/L — AB (ref 101–111)
CO2: 25 mmol/L (ref 22–32)
CREATININE: 0.66 mg/dL (ref 0.61–1.24)
Calcium: 8.5 mg/dL — ABNORMAL LOW (ref 8.9–10.3)
GFR calc non Af Amer: >60 mL/min (ref >60)
Glucose, Bld: 176 mg/dL — ABNORMAL HIGH (ref 65–99)
POTASSIUM: 3.8 mmol/L (ref 3.5–5.1)
SODIUM: 133 mmol/L — AB (ref 135–145)

## 2016-12-02 LAB — CBC
HCT: 41.2 % (ref 39.0–52.0)
HEMOGLOBIN: 14 g/dL (ref 13.0–17.0)
MCH: 29.5 pg (ref 26.0–34.0)
MCHC: 34 g/dL (ref 30.0–36.0)
MCV: 86.9 fL (ref 78.0–100.0)
PLATELETS: 256 10*3/uL (ref 150–400)
RBC: 4.74 MIL/uL (ref 4.22–5.81)
RDW: 13 % (ref 11.5–15.5)
WBC: 10.3 10*3/uL (ref 4.0–10.5)

## 2016-12-02 LAB — I-STAT TROPONIN, ED
TROPONIN I, POC: 0 ng/mL (ref 0.00–0.08)
TROPONIN I, POC: 0 ng/mL (ref 0.00–0.08)

## 2016-12-02 MED ORDER — OMEPRAZOLE 20 MG PO CPDR: 20.0000 mg | DELAYED_RELEASE_CAPSULE | Freq: Every day | ORAL | 0 refills | Status: DC

## 2016-12-02 MED ORDER — GI COCKTAIL ~~LOC~~
30.0000 mL | Freq: Once | ORAL | Status: AC
  Administered 2016-12-02: 30 mL via ORAL
  Filled 2016-12-02: qty 30

## 2016-12-02 MED FILL — OMEPRAZOLE 20 MG CAP: 20 | 30 days supply | Qty: 30 | Fill #0

## 2016-12-02 NOTE — ED Notes (Signed)
Patient transported to X-ray 

## 2016-12-02 NOTE — Discharge Instructions (Signed)
We're unsure what is causing her chest pain today. It could be a muscle, indigestion, or your heart. We think it's reasonably safe to return home and that we don't think you're having a heart attack at this moment. However we want you to follow up with your primary care and/or cardiology.  You may try this medication and we're giving you to decrease acid that your stomach is producing.   Please return if your chest pain comes back, you had any sweating, shortness of breath or other concerns.

## 2016-12-02 NOTE — ED Triage Notes (Signed)
Pt to ER for evaluation of substernal chest tightness onset 3-4 days ago, states is worse after eating and lying down and believed it was indigestion until last night when he had an episode of "the sweats." states hx of HTN, hyperlipidemia, and diabetes, is on medications for this. Denies smoking. Denies pain at this time. NAD.

## 2016-12-02 NOTE — ED Provider Notes (Signed)
[facility] EMERGENCY DEPARTMENT Provider Note   CSN: [id number] Arrival date & time: 12/02/16  9798     History   Chief Complaint Chief Complaint  Patient presents with  . Chest Pain    HPI Miguel Estrada is a 61 y.o. male.  HPI   Patient is a 61 year old male presenting with atypical chest pain. Patient thought he had indigestion symptoms since last night. He thought it was just indigestion but he continued to have the symptoms this morning. He said it feels better when he burps. The pain does not radiate anywhere. He reports he did have a little bit of cold sweats associated with it this morning. Patient was driving to work and felt uncomfortable so came here so be evaluated.   Ho HTN, HLD and DM.   Past Medical History:  Diagnosis Date  . Anxiety   . Arthritis    neck  . Cataract    left eye  . Diabetes mellitus without complication ([place])   . Hyperlipidemia   . Hypertension     There are no active problems to display for this patient.   Past Surgical History:  Procedure Laterality Date  . COLONOSCOPY  11-05-2006   Dr.Buccini-normal colon  . TONSILLECTOMY  age 27       Home Medications    Prior to Admission medications   Medication Sig Start Date End Date Taking? Authorizing Provider  BABY ASPIRIN PO Take 81 mg by mouth daily.    [provider]  cyclobenzaprine (FLEXERIL) 5 MG tablet Take 1 tablet (5 mg total) by mouth 3 (three) times daily as needed for muscle spasms. 06/04/13   Carvel Getting, NP  LORazepam (ATIVAN) 0.5 MG tablet Take 0.5 mg by mouth as needed for anxiety.    [provider]  losartan (COZAAR) 50 MG tablet Take 50 mg by mouth daily.    [provider]  metFORMIN (GLUCOPHAGE) 1000 MG tablet Take 1,000 mg by mouth daily with breakfast.    [provider]  pravastatin (PRAVACHOL) 80 MG tablet Take 80 mg by mouth daily.    [provider]  Saw Palmetto, Serenoa repens, (SAW  PALMETTO PO) Take by mouth daily.    [provider]    Family History Family History  Problem Relation Age of Onset  . Colon polyps Brother   . Colon cancer Neg Hx     Social History Social History  Substance Use Topics  . Smoking status: Former Research scientist (life sciences)  . Smokeless tobacco: Never Used     Comment: quit smoking 30 years ago  . Alcohol use 1.2 oz/week    2 Cans of beer per week     Allergies   Penicillins   Review of Systems Review of Systems  Constitutional: Negative for activity change.  Respiratory: Negative for shortness of breath.   Cardiovascular: Positive for chest pain.  Gastrointestinal: Negative for abdominal pain.     Physical Exam Updated Vital Signs BP (!) 161/86 (BP Location: Left Arm)   Pulse 88   Temp 98 F (36.7 C) (Oral)   Resp 18   SpO2 100%   Physical Exam  Constitutional: He is oriented to person, place, and time. He appears well-nourished.  HENT:  Head: Normocephalic.  Eyes: Conjunctivae are normal.  Cardiovascular: Normal rate and regular rhythm.   Pulmonary/Chest: Effort normal and breath sounds normal. No respiratory distress. He has no wheezes.  Abdominal: Soft. There is no tenderness.  Neurological: He is  oriented to person, place, and time.  Skin: Skin is warm and dry. He is not diaphoretic.  Psychiatric: He has a normal mood and affect. His behavior is normal.     ED Treatments / Results  Labs (all labs ordered are listed, but only abnormal results are displayed) Labs Reviewed  BASIC METABOLIC PANEL - Abnormal; Notable for the following:       Result Value   Sodium 133 (*)    Chloride 100 (*)    Glucose, Bld 176 (*)    Calcium 8.5 (*)    All other components within normal limits  CBC  I-STAT TROPONIN, ED    EKG  EKG Interpretation  Date/Time:  Wednesday December 02 2016 07:40:56 EDT Ventricular Rate:  88 PR Interval:    QRS Duration: 196 QT Interval:  389 QTC Calculation: 471 R Axis:   55 Text  Interpretation:  Sinus rhythm IVCD, consider atypical RBBB Normal sinus rhythm Confirmed by Thomasene Lot, Nahlia Hellmann [phone]) on 12/02/2016 7:54:02 AM       Radiology No results found.  Procedures Procedures (including critical care time)  Medications Ordered in ED Medications - No data to display   Initial Impression / Assessment and Plan / ED Course  I have reviewed the triage vital signs and the nursing notes.  Pertinent labs & imaging results that were available during my care of the patient were reviewed by me and considered in my medical decision making (see chart for details).     Well-appearing 61 year old male presenting with atypical chest pain. Heart score of 3. EKG nonischemic. We'll do delta troponin. The symptoms sound very much more related to ingestion the due to cardiac ischemia.  Delta negative. WIll have him follow up with his PCP, take omeprazole incase it is indigestion and monitor symptoms. Return precautions expressed.   Final Clinical Impressions(s) / ED Diagnoses   Final diagnoses:  None    New Prescriptions New Prescriptions   No medications on file     Macarthur Critchley, MD 12/02/16 1611

## 2016-12-02 NOTE — ED Notes (Signed)
Pt returns from radiology continues to be tele.

## 2016-12-08 MED FILL — LORazepam 0.5 MG TABS: 0.5 | 30 days supply | Qty: 60 | Fill #0

## 2017-01-28 MED FILL — LORazepam 0.5 MG TABS: 0.5 | 30 days supply | Qty: 60 | Fill #0

## 2017-02-04 MED FILL — LOSARTAN POTASSIUM 50 MG TA: 50 | 90 days supply | Qty: 90 | Fill #0

## 2017-02-04 MED FILL — METFORMIN HCL ER 500 MG TAB: 500 | 90 days supply | Qty: 180 | Fill #0

## 2017-04-07 MED FILL — CICLOPIROX 8% SOLUTION: 8 | 20 days supply | Qty: 7 | Fill #0

## 2017-05-20 MED FILL — LORazepam 0.5 MG TABS: 0.5 | 30 days supply | Qty: 60 | Fill #0

## 2017-05-20 MED FILL — TAMSULOSIN HCL 0.4 MG CAP: 0.4 | 90 days supply | Qty: 90 | Fill #0

## 2017-06-01 MED FILL — LOSARTAN POTASSIUM 50 MG TA: 50 | 30 days supply | Qty: 30 | Fill #0

## 2017-07-01 MED FILL — LOSARTAN POTASSIUM 50 MG TA: 50 | 30 days supply | Qty: 30 | Fill #0

## 2017-07-19 DIAGNOSIS — E119 Type 2 diabetes mellitus without complications: Secondary | ICD-10-CM | POA: Diagnosis not present

## 2017-07-19 DIAGNOSIS — M47812 Spondylosis without myelopathy or radiculopathy, cervical region: Secondary | ICD-10-CM | POA: Diagnosis not present

## 2017-07-19 DIAGNOSIS — E7800 Pure hypercholesterolemia, unspecified: Secondary | ICD-10-CM | POA: Diagnosis not present

## 2017-07-19 DIAGNOSIS — R42 Dizziness and giddiness: Secondary | ICD-10-CM | POA: Diagnosis not present

## 2017-07-19 DIAGNOSIS — Z125 Encounter for screening for malignant neoplasm of prostate: Secondary | ICD-10-CM | POA: Diagnosis not present

## 2017-07-19 DIAGNOSIS — I10 Essential (primary) hypertension: Secondary | ICD-10-CM | POA: Diagnosis not present

## 2017-07-19 DIAGNOSIS — F419 Anxiety disorder, unspecified: Secondary | ICD-10-CM | POA: Diagnosis not present

## 2017-07-19 DIAGNOSIS — B351 Tinea unguium: Secondary | ICD-10-CM | POA: Diagnosis not present

## 2017-07-19 DIAGNOSIS — M7061 Trochanteric bursitis, right hip: Secondary | ICD-10-CM | POA: Diagnosis not present

## 2017-07-19 DIAGNOSIS — N400 Enlarged prostate without lower urinary tract symptoms: Secondary | ICD-10-CM | POA: Diagnosis not present

## 2017-07-19 MED FILL — PRAVASTATIN SODIUM 80 MG TA: 80 | 90 days supply | Qty: 90 | Fill #0

## 2017-07-19 MED FILL — METFORMIN HCL ER 500 MG TAB: 500 | 90 days supply | Qty: 180 | Fill #0

## 2017-07-22 MED FILL — FREESTYLE LITE METER: 1 days supply | Qty: 1 | Fill #0

## 2017-07-23 MED FILL — FREESTYLE LITE TEST STRIP: 90 days supply | Qty: 100 | Fill #0

## 2017-07-29 MED FILL — LORazepam 0.5 MG TABS: 0.5 | 30 days supply | Qty: 60 | Fill #0

## 2017-07-29 MED FILL — LOSARTAN POTASSIUM 50 MG TA: 50 | 90 days supply | Qty: 90 | Fill #0

## 2017-08-13 ENCOUNTER — Telehealth: Payer: Self-pay | Admitting: Internal Medicine

## 2017-08-13 ENCOUNTER — Encounter: Payer: Self-pay | Admitting: Internal Medicine

## 2017-08-13 NOTE — Telephone Encounter (Signed)
His last colon was 01-06-12 and he was supposed to have a 5 year follow-up which means he is overdue.  Please schedule ASAP.

## 2017-08-13 NOTE — Telephone Encounter (Signed)
Pt called stating that he is due for a colon but recall date indicates 2023. Pt said that he had polyps in his last one in 2013 and was told that needed a repeat one in 5 years. Could you pls confirm recall date?

## 2017-08-17 NOTE — Telephone Encounter (Signed)
Recall colon sch 10/19/17 at 2:30pm.

## 2017-09-22 MED FILL — CICLOPIROX 8% SOLUTION: 8 | 30 days supply | Qty: 7 | Fill #0

## 2017-09-23 MED FILL — LORazepam 0.5 MG TABS: 0.5 | 30 days supply | Qty: 60 | Fill #1

## 2017-10-11 ENCOUNTER — Encounter: Payer: Self-pay | Admitting: Internal Medicine

## 2017-10-11 ENCOUNTER — Ambulatory Visit (AMBULATORY_SURGERY_CENTER): Payer: Self-pay

## 2017-10-11 VITALS — Ht 73.5 in | Wt 237.6 lb

## 2017-10-11 DIAGNOSIS — Z86010 Personal history of colonic polyps: Secondary | ICD-10-CM

## 2017-10-11 MED ORDER — NA SULFATE-K SULFATE-MG SULF 17.5-3.13-1.6 GM/177ML PO SOLN: 1.0000 | Freq: Once | ORAL | 0 refills | Status: AC

## 2017-10-11 MED FILL — SUPREP BOWEL PREP KIT: 17.5-3.13-1 | 1 days supply | Qty: 354 | Fill #0

## 2017-10-11 MED FILL — TAMSULOSIN HCL 0.4 MG CAP: 0.4 | 90 days supply | Qty: 90 | Fill #0

## 2017-10-11 NOTE — Progress Notes (Signed)
Denies allergies to eggs or soy products. Denies complication of anesthesia or sedation. Denies use of weight loss medication. Denies use of O2.   Emmi instructions declined.  

## 2017-10-19 ENCOUNTER — Encounter: Payer: Self-pay | Admitting: Internal Medicine

## 2017-10-19 ENCOUNTER — Ambulatory Visit (AMBULATORY_SURGERY_CENTER): Payer: 59 | Admitting: Internal Medicine

## 2017-10-19 VITALS — BP 122/77 | HR 69 | Temp 98.4°F | Resp 22 | Ht 73.5 in | Wt 237.0 lb

## 2017-10-19 DIAGNOSIS — E119 Type 2 diabetes mellitus without complications: Secondary | ICD-10-CM | POA: Diagnosis not present

## 2017-10-19 DIAGNOSIS — K5140 Inflammatory polyps of colon without complications: Secondary | ICD-10-CM | POA: Diagnosis not present

## 2017-10-19 DIAGNOSIS — Z86010 Personal history of colonic polyps: Secondary | ICD-10-CM

## 2017-10-19 DIAGNOSIS — K635 Polyp of colon: Secondary | ICD-10-CM

## 2017-10-19 DIAGNOSIS — D123 Benign neoplasm of transverse colon: Secondary | ICD-10-CM

## 2017-10-19 DIAGNOSIS — D122 Benign neoplasm of ascending colon: Secondary | ICD-10-CM

## 2017-10-19 DIAGNOSIS — I10 Essential (primary) hypertension: Secondary | ICD-10-CM | POA: Diagnosis not present

## 2017-10-19 MED ORDER — SODIUM CHLORIDE 0.9 % IV SOLN: 500.0000 mL | Freq: Once | INTRAVENOUS | Status: DC

## 2017-10-19 NOTE — Op Note (Signed)
[facility] Patient Name: Miguel Estrada Procedure Date: 10/19/2017 2:52 PM MRN: [id number] Endoscopist: Jerene Bears , MD Age: 62 Referring MD:  Date of Birth: 16-Jun-1955 Gender: Male Account #: [id number] Procedure:                Colonoscopy Indications:              Surveillance: Personal history of adenomatous                            polyps on last colonoscopy 5 years ago Medicines:                Monitored Anesthesia Care Procedure:                Pre-Anesthesia Assessment:                           - Prior to the procedure, a History and Physical                            was performed, and patient medications and                            allergies were reviewed. The patient's tolerance of                            previous anesthesia was also reviewed. The risks                            and benefits of the procedure and the sedation                            options and risks were discussed with the patient.                            All questions were answered, and informed consent                            was obtained. Prior Anticoagulants: The patient has                            taken no previous anticoagulant or antiplatelet                            agents. ASA Grade Assessment: II - A patient with                            mild systemic disease. After reviewing the risks                            and benefits, the patient was deemed in                            satisfactory condition to undergo the procedure.  After obtaining informed consent, the colonoscope                            was passed under direct vision. Throughout the                            procedure, the patient's blood pressure, pulse, and                            oxygen saturations were monitored continuously. The                            Colonoscope was introduced through the anus and                            advanced to the cecum, identified  by appendiceal                            orifice and ileocecal valve. The colonoscopy was                            performed without difficulty. The patient tolerated                            the procedure well. The quality of the bowel                            preparation was good. The ileocecal valve,                            appendiceal orifice, and rectum were photographed. Scope In: 3:02:08 PM Scope Out: 3:16:33 PM Scope Withdrawal Time: 0 hours 11 minutes 44 seconds  Total Procedure Duration: 0 hours 14 minutes 25 seconds  Findings:                 The digital rectal exam was normal.                           A single medium-sized angioectasia without bleeding                            was found in the ascending colon.                           Two sessile polyps were found in the ascending                            colon. The polyps were 3 to 4 mm in size. These                            polyps were removed with a cold snare. Resection                            and retrieval were complete.  A 6 mm polyp was found in the transverse colon. The                            polyp was sessile. The polyp was removed with a                            cold snare. Resection and retrieval were complete.                           Multiple diverticula were found in the sigmoid                            colon.                           Internal hemorrhoids were found during                            retroflexion. The hemorrhoids were small. Complications:            No immediate complications. Estimated Blood Loss:     Estimated blood loss was minimal. Impression:               - A single non-bleeding colonic angioectasia.                           - Two 3 to 4 mm polyps in the ascending colon,                            removed with a cold snare. Resected and retrieved.                           - One 6 mm polyp in the transverse colon, removed                             with a cold snare. Resected and retrieved.                           - Diverticulosis in the sigmoid colon.                           - Small internal hemorrhoids. Recommendation:           - Patient has a contact number available for                            emergencies. The signs and symptoms of potential                            delayed complications were discussed with the                            patient. Return to normal activities tomorrow.  Written discharge instructions were provided to the                            patient.                           - Resume previous diet.                           - Continue present medications.                           - Await pathology results.                           - Repeat colonoscopy is recommended for                            surveillance. The colonoscopy date will be                            determined after pathology results from today's                            exam become available for review. Jerene Bears, MD 10/19/2017 3:27:20 PM This report has been signed electronically.

## 2017-10-19 NOTE — Progress Notes (Signed)
Called to room to assist during endoscopic procedure.  Patient ID and intended procedure confirmed with present staff. Received instructions for my participation in the procedure from the performing physician.  

## 2017-10-19 NOTE — Progress Notes (Signed)
No problems noted in the recovery room. maw 

## 2017-10-19 NOTE — Progress Notes (Signed)
Report to PACU, RN, vss, BBS= Clear.  

## 2017-10-19 NOTE — Patient Instructions (Addendum)
YOU HAD AN ENDOSCOPIC PROCEDURE TODAY AT [facility]:   Refer to the procedure report that was given to you for any specific questions about what was found during the examination.  If the procedure report does not answer your questions, please call your gastroenterologist to clarify.  If you requested that your care partner not be given the details of your procedure findings, then the procedure report has been included in a sealed envelope for you to review at your convenience later.  YOU SHOULD EXPECT: Some feelings of bloating in the abdomen. Passage of more gas than usual.  Walking can help get rid of the air that was put into your GI tract during the procedure and reduce the bloating. If you had a lower endoscopy (such as a colonoscopy or flexible sigmoidoscopy) you may notice spotting of blood in your stool or on the toilet paper. If you underwent a bowel prep for your procedure, you may not have a normal bowel movement for a few days.  Please Note:  You might notice some irritation and congestion in your nose or some drainage.  This is from the oxygen used during your procedure.  There is no need for concern and it should clear up in a day or so.  SYMPTOMS TO REPORT IMMEDIATELY:   Following lower endoscopy (colonoscopy or flexible sigmoidoscopy):  Excessive amounts of blood in the stool  Significant tenderness or worsening of abdominal pains  Swelling of the abdomen that is new, acute  Fever of 100F or higher  For urgent or emergent issues, a gastroenterologist can be reached at any hour by calling [phone].   DIET:  We do recommend a small meal at first, but then you may proceed to your regular diet.  Drink plenty of fluids but you should avoid alcoholic beverages for 24 hours.  ACTIVITY:  You should plan to take it easy for the rest of today and you should NOT DRIVE or use heavy machinery until tomorrow (because of the sedation medicines used during the test).     FOLLOW UP: Our staff will call the number listed on your records the next business day following your procedure to check on you and address any questions or concerns that you may have regarding the information given to you following your procedure. If we do not reach you, we will leave a message.  However, if you are feeling well and you are not experiencing any problems, there is no need to return our call.  We will assume that you have returned to your regular daily activities without incident.  If any biopsies were taken you will be contacted by phone or by letter within the next 1-3 weeks.  Please call us at [phone] if you have not heard about the biopsies in 3 weeks.    SIGNATURES/CONFIDENTIALITY: You and/or your care partner have signed paperwork which will be entered into your electronic medical record.  These signatures attest to the fact that that the information above on your After Visit Summary has been reviewed and is understood.  Full responsibility of the confidentiality of this discharge information lies with you and/or your care-partner.   Handouts were given to your care partner on polyps, diverticulosis, and hemorroids. Your blood sugar was 115 in the recovery room. You may resume your current medications today. Await biopsy results. Please call if any questions or concerns.

## 2017-10-20 ENCOUNTER — Telehealth: Payer: Self-pay

## 2017-10-20 ENCOUNTER — Telehealth: Payer: Self-pay | Admitting: Internal Medicine

## 2017-10-20 NOTE — Telephone Encounter (Signed)
Patient called in inquire what type of polyp he had removed yesterday.  Advised that it was sent for pathology and final results would be known 7-10 days and a letter would be sent. Patient verbalized understanding.

## 2017-10-20 NOTE — Telephone Encounter (Signed)
  Follow up Call-  Call back number 10/19/2017  Post procedure Call Back phone  # [phone]  Permission to leave phone message Yes  Some recent data might be hidden     Patient questions:  Do you have a fever, pain , or abdominal swelling? No. Pain Score  0 *  Have you tolerated food without any problems? Yes.    Have you been able to return to your normal activities? Yes.    Do you have any questions about your discharge instructions: Diet   No. Medications  No. Follow up visit  No.  Do you have questions or concerns about your Care? No.  Actions: * If pain score is 4 or above: No action needed, pain <4.

## 2017-10-22 ENCOUNTER — Encounter: Payer: Self-pay | Admitting: Internal Medicine

## 2017-11-08 MED FILL — SILDENAFIL CITRATE 100 MG T: 100 | 50 days supply | Qty: 10 | Fill #0

## 2017-11-10 MED FILL — LOSARTAN POTASSIUM 50 MG TA: 50 | 90 days supply | Qty: 90 | Fill #1

## 2017-11-10 MED FILL — METFORMIN HCL ER 500 MG TAB: 500 | 90 days supply | Qty: 180 | Fill #1

## 2017-11-16 DIAGNOSIS — J069 Acute upper respiratory infection, unspecified: Secondary | ICD-10-CM | POA: Diagnosis not present

## 2017-11-16 DIAGNOSIS — R0789 Other chest pain: Secondary | ICD-10-CM | POA: Diagnosis not present

## 2017-11-16 MED FILL — NAPROXEN SODIUM 550 MG TABS: 550 | 10 days supply | Qty: 20 | Fill #0

## 2017-12-01 MED FILL — LORazepam 0.5 MG TABS: 0.5 | 30 days supply | Qty: 60 | Fill #2

## 2018-01-21 MED FILL — LORazepam 0.5 MG TABS: 0.5 | 30 days supply | Qty: 60 | Fill #0

## 2018-02-07 MED FILL — LOSARTAN POTASSIUM 50 MG TA: 50 | 90 days supply | Qty: 90 | Fill #0

## 2018-02-18 MED FILL — metFORMIN HCL ER 500 MG TB2: 500 | 90 days supply | Qty: 180 | Fill #0

## 2018-02-18 MED FILL — LOSARTAN POTASSIUM 50 MG TA: 50 | 90 days supply | Qty: 90 | Fill #0

## 2018-04-19 MED FILL — LORazepam 0.5 MG TABS: 0.5 | 30 days supply | Qty: 60 | Fill #1 | Status: TO

## 2018-05-06 MED FILL — TAMSULOSIN HCL 0.4 MG CAP: 0.4 | 90 days supply | Qty: 90 | Fill #1

## 2018-06-06 MED FILL — metFORMIN HCL ER 500 MG TB2: 500 | 90 days supply | Qty: 180 | Fill #0

## 2018-06-06 MED FILL — LORazepam 0.5 MG TABS: 0.5 | 30 days supply | Qty: 60 | Fill #0

## 2018-06-06 MED FILL — LOSARTAN POTASSIUM 50 MG TA: 50 | 30 days supply | Qty: 30 | Fill #0 | Status: TO

## 2018-06-17 MED FILL — FREESTYLE LITE TEST STRIP: 50 days supply | Qty: 100 | Fill #0

## 2018-06-23 DIAGNOSIS — Z7984 Long term (current) use of oral hypoglycemic drugs: Secondary | ICD-10-CM | POA: Diagnosis not present

## 2018-06-23 DIAGNOSIS — E7800 Pure hypercholesterolemia, unspecified: Secondary | ICD-10-CM | POA: Diagnosis not present

## 2018-06-23 DIAGNOSIS — I10 Essential (primary) hypertension: Secondary | ICD-10-CM | POA: Diagnosis not present

## 2018-06-23 DIAGNOSIS — E119 Type 2 diabetes mellitus without complications: Secondary | ICD-10-CM | POA: Diagnosis not present

## 2018-07-13 MED FILL — LOSARTAN POTASSIUM 50 MG TA: 50 | 30 days supply | Qty: 30 | Fill #0

## 2018-08-04 MED FILL — TAMSULOSIN HCL 0.4 MG CAP: 0.4 | 90 days supply | Qty: 90 | Fill #0

## 2018-08-04 MED FILL — LORazepam 0.5 MG TABS: 0.5 | 30 days supply | Qty: 60 | Fill #0

## 2018-08-08 MED FILL — LOSARTAN POTASSIUM 50 MG TA: 50 | 30 days supply | Qty: 30 | Fill #1

## 2018-08-10 MED FILL — CICLOPIROX 8% SOLUTION: 8 | 30 days supply | Qty: 7 | Fill #0

## 2018-08-16 MED FILL — FREESTYLE LITE TEST STRIP: 50 days supply | Qty: 100 | Fill #0

## 2018-09-05 MED FILL — metFORMIN HCL ER 500 MG TB2: 500 | 90 days supply | Qty: 360 | Fill #0

## 2018-09-14 MED FILL — LOSARTAN POTASSIUM 50 MG TA: 50 | 90 days supply | Qty: 90 | Fill #0

## 2018-10-06 MED FILL — LORazepam 0.5 MG TABS: 0.5 | 30 days supply | Qty: 60 | Fill #1

## 2018-10-18 MED FILL — SILDENAFIL CITRATE 100 MG T: 100 | 50 days supply | Qty: 10 | Fill #1

## 2018-11-18 MED FILL — LORazepam 0.5 MG TABS: 0.5 | 30 days supply | Qty: 60 | Fill #2

## 2018-12-21 MED FILL — METFORMIN HCL ER 500 MG TB2: 500 | 90 days supply | Qty: 360 | Fill #1

## 2019-01-11 MED FILL — LORAZEPAM 0.5 MG TABS: 0.5 | 30 days supply | Qty: 60 | Fill #3

## 2019-01-19 MED FILL — LOSARTAN POTASSIUM 50 MG TA: 50 | 30 days supply | Qty: 30 | Fill #0

## 2019-01-30 MED FILL — FREESTYLE LITE TEST STRIP: 50 days supply | Qty: 100 | Fill #1

## 2019-02-21 MED FILL — LOSARTAN POTASSIUM 50 MG TA: 50 | 30 days supply | Qty: 30 | Fill #0

## 2019-03-01 DIAGNOSIS — E7800 Pure hypercholesterolemia, unspecified: Secondary | ICD-10-CM | POA: Diagnosis not present

## 2019-03-01 DIAGNOSIS — N400 Enlarged prostate without lower urinary tract symptoms: Secondary | ICD-10-CM | POA: Diagnosis not present

## 2019-03-01 DIAGNOSIS — Z125 Encounter for screening for malignant neoplasm of prostate: Secondary | ICD-10-CM | POA: Diagnosis not present

## 2019-03-01 DIAGNOSIS — M47812 Spondylosis without myelopathy or radiculopathy, cervical region: Secondary | ICD-10-CM | POA: Diagnosis not present

## 2019-03-01 DIAGNOSIS — F419 Anxiety disorder, unspecified: Secondary | ICD-10-CM | POA: Diagnosis not present

## 2019-03-01 DIAGNOSIS — E119 Type 2 diabetes mellitus without complications: Secondary | ICD-10-CM | POA: Diagnosis not present

## 2019-03-01 DIAGNOSIS — I10 Essential (primary) hypertension: Secondary | ICD-10-CM | POA: Diagnosis not present

## 2019-03-01 MED FILL — SILDENAFIL CITRATE 100 MG T: 100 | 30 days supply | Qty: 10 | Fill #0

## 2019-03-01 MED FILL — GABAPENTIN 300 MG CAPSULE: 300 | 90 days supply | Qty: 90 | Fill #0

## 2019-03-08 MED FILL — LORazepam 0.5 MG TABS: 0.5 | 30 days supply | Qty: 60 | Fill #0

## 2019-03-29 MED FILL — LOSARTAN POTASSIUM 50 MG TA: 50 | 90 days supply | Qty: 90 | Fill #0

## 2019-04-06 MED FILL — METFORMIN HCL ER 500 MG TB2: 500 | 90 days supply | Qty: 360 | Fill #2

## 2019-05-11 MED FILL — FREESTYLE LITE TEST STRIP: 50 days supply | Qty: 100 | Fill #2

## 2019-05-11 MED FILL — LORazepam 0.5 MG TABS: 0.5 | 30 days supply | Qty: 60 | Fill #1

## 2019-06-02 DIAGNOSIS — E119 Type 2 diabetes mellitus without complications: Secondary | ICD-10-CM | POA: Diagnosis not present

## 2019-06-02 DIAGNOSIS — Z125 Encounter for screening for malignant neoplasm of prostate: Secondary | ICD-10-CM | POA: Diagnosis not present

## 2019-06-02 DIAGNOSIS — I10 Essential (primary) hypertension: Secondary | ICD-10-CM | POA: Diagnosis not present

## 2019-06-02 DIAGNOSIS — M7062 Trochanteric bursitis, left hip: Secondary | ICD-10-CM | POA: Diagnosis not present

## 2019-06-02 DIAGNOSIS — N400 Enlarged prostate without lower urinary tract symptoms: Secondary | ICD-10-CM | POA: Diagnosis not present

## 2019-06-02 DIAGNOSIS — M792 Neuralgia and neuritis, unspecified: Secondary | ICD-10-CM | POA: Diagnosis not present

## 2019-06-02 DIAGNOSIS — E7800 Pure hypercholesterolemia, unspecified: Secondary | ICD-10-CM | POA: Diagnosis not present

## 2019-06-09 ENCOUNTER — Other Ambulatory Visit (HOSPITAL_COMMUNITY): Payer: Self-pay | Admitting: Family Medicine

## 2019-06-09 MED FILL — GLIMEPIRIDE 4 MG TABLET: 4 | 30 days supply | Qty: 30 | Fill #0

## 2019-06-15 MED FILL — SILDENAFIL CITRATE 100 MG T: 100 | 30 days supply | Qty: 6 | Fill #1

## 2019-07-05 MED FILL — LOSARTAN POTASSIUM 50 MG TA: 50 | 90 days supply | Qty: 90 | Fill #1

## 2019-07-05 MED FILL — LORazepam 0.5 MG TABS: 0.5 | 30 days supply | Qty: 60 | Fill #2

## 2019-07-14 MED FILL — GLIMEPIRIDE 4 MG TABLET: 4 | 30 days supply | Qty: 30 | Fill #1

## 2019-07-14 MED FILL — SILDENAFIL CITRATE 100 MG T: 100 | 30 days supply | Qty: 6 | Fill #2

## 2019-07-14 MED FILL — METFORMIN HCL ER 500 MG TB2: 500 | 90 days supply | Qty: 360 | Fill #3

## 2019-07-27 DIAGNOSIS — R300 Dysuria: Secondary | ICD-10-CM | POA: Diagnosis not present

## 2019-08-15 ENCOUNTER — Other Ambulatory Visit (HOSPITAL_COMMUNITY): Payer: Self-pay | Admitting: Family Medicine

## 2019-08-15 MED FILL — FREESTYLE LITE TEST STRIP: 50 days supply | Qty: 100 | Fill #0

## 2019-08-15 MED FILL — LORazepam 0.5 MG TABS: 0.5 | 30 days supply | Qty: 60 | Fill #0

## 2019-08-28 IMAGING — DX DG CHEST 2V
2 series · 2 of 2 positions shown · non-contrast
Comparison: None.

CLINICAL DATA: Mid chest pain, epigastric

EXAM:
CHEST  2 VIEW

[chest pa]
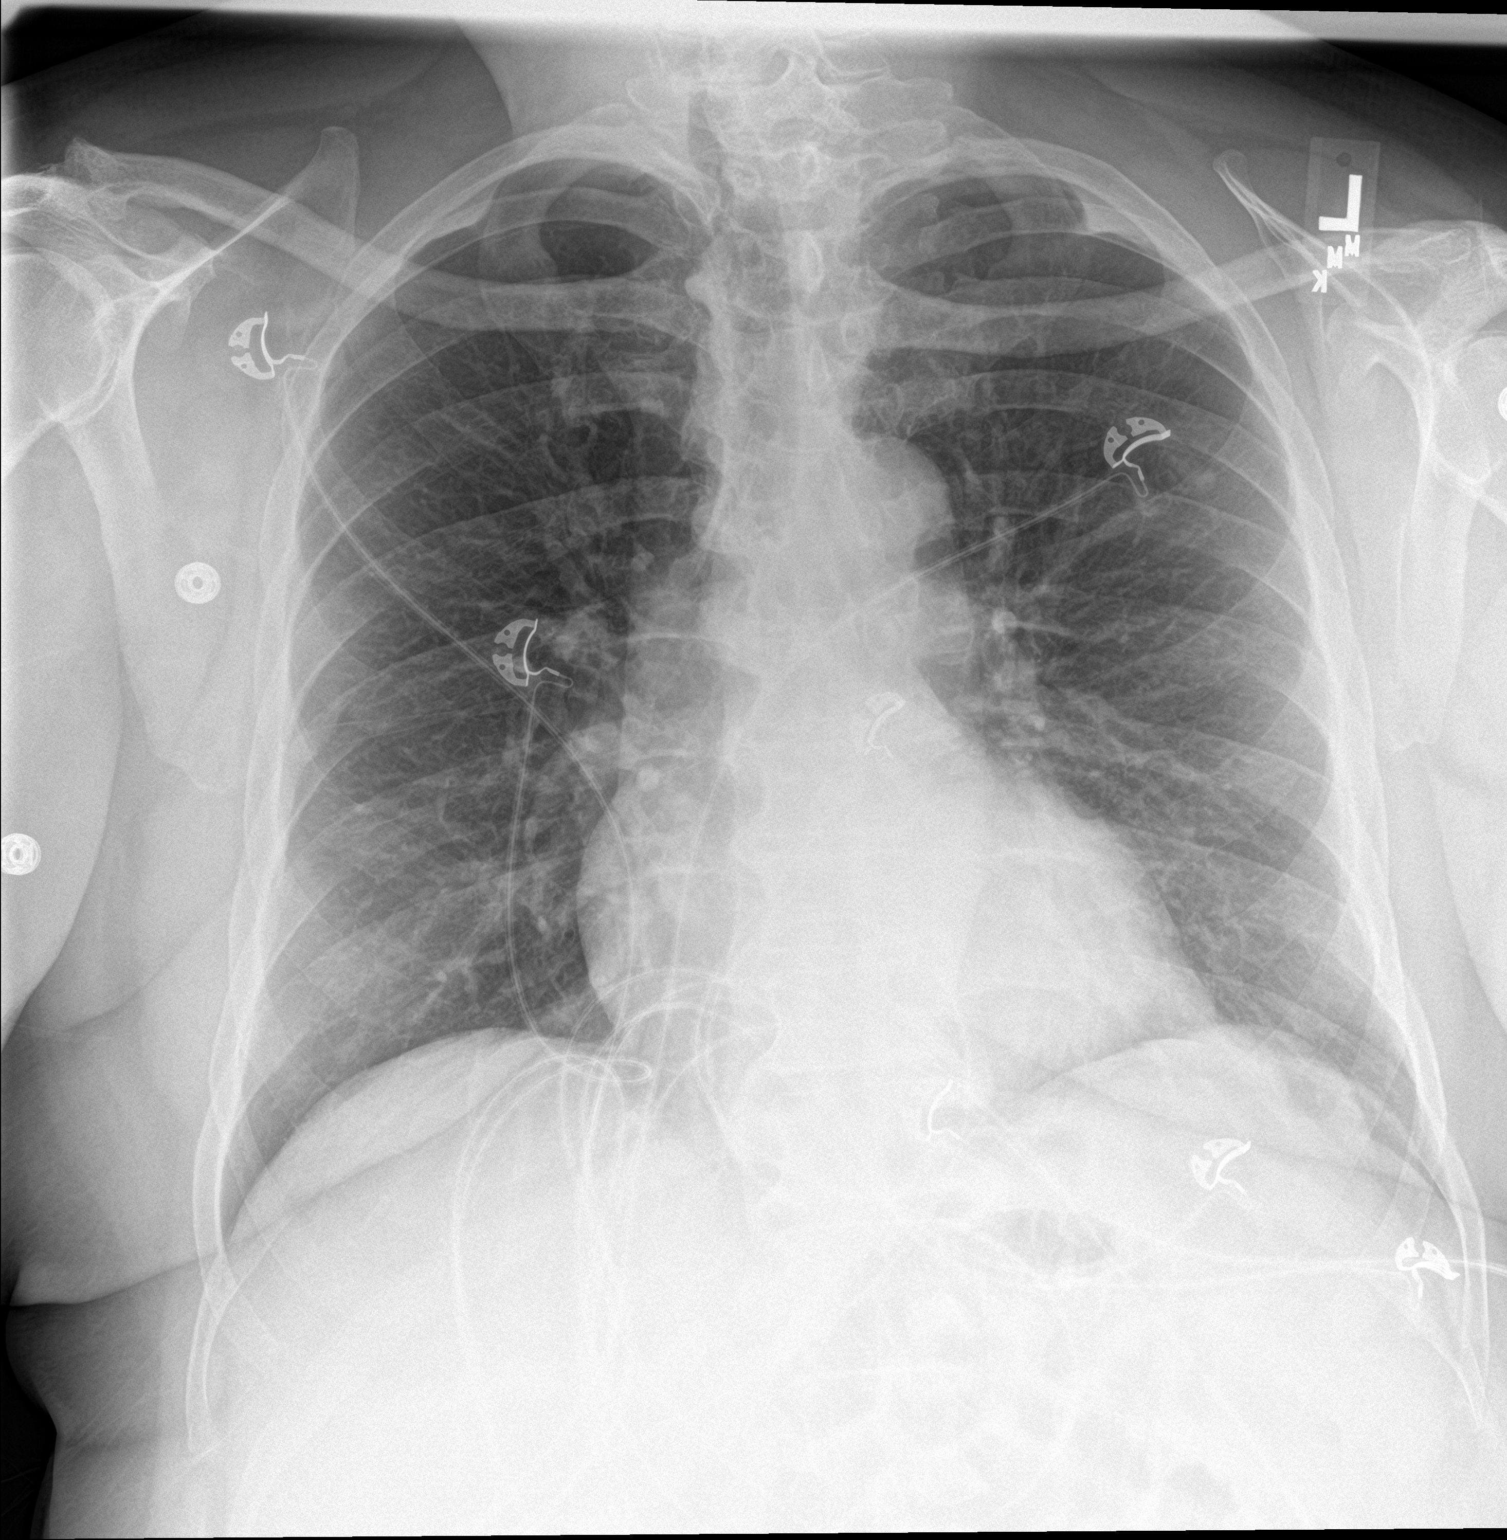

[chest lat]
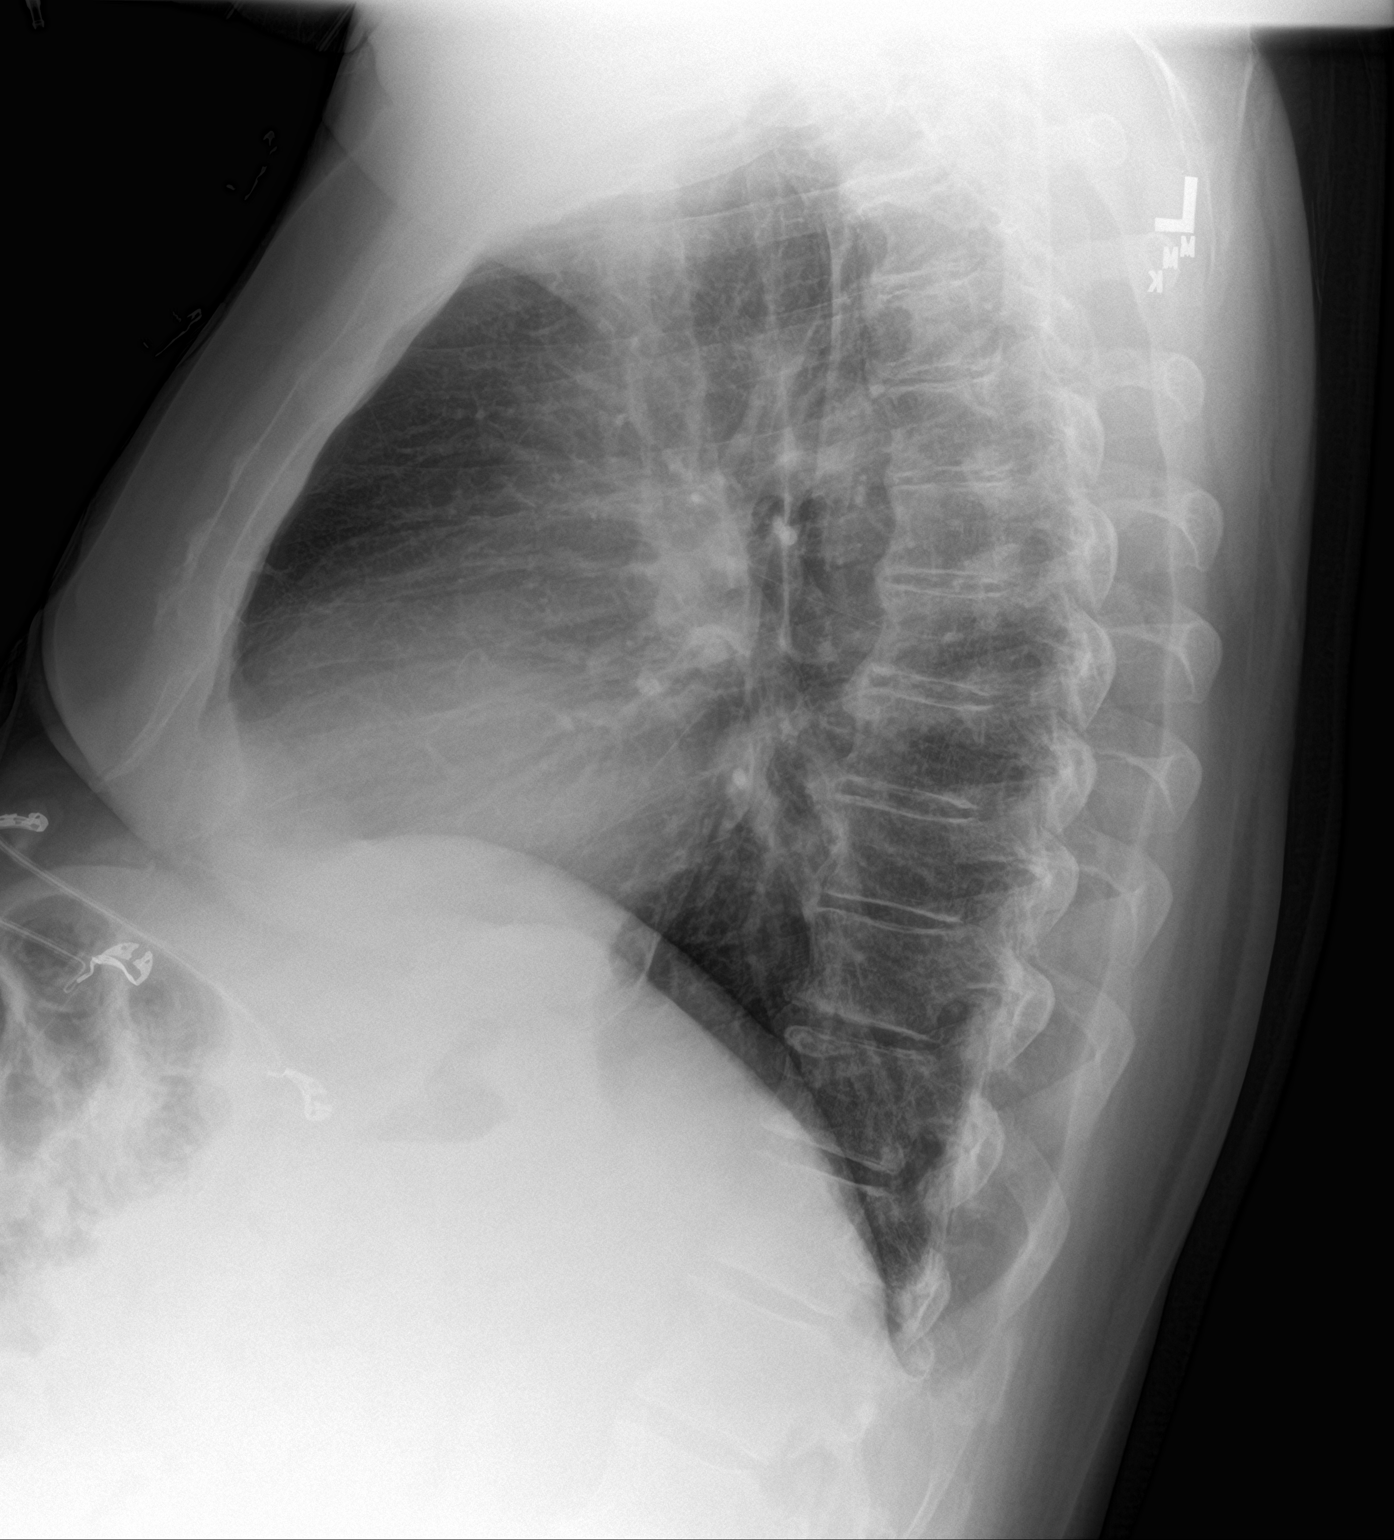

[2 of 2 positions shown; findings below may reference images not displayed]

FINDINGS: Normal mediastinum and cardiac silhouette. Normal pulmonary
vasculature. No evidence of effusion, infiltrate, or pneumothorax.
No acute bony abnormality. Degenerative osteophytosis of the spine.
IMPRESSION: No acute cardiopulmonary process.

## 2019-09-22 MED FILL — SILDENAFIL CITRATE 100 MG T: 100 | 30 days supply | Qty: 6 | Fill #3

## 2019-10-12 ENCOUNTER — Other Ambulatory Visit (HOSPITAL_COMMUNITY): Payer: Self-pay | Admitting: Family Medicine

## 2019-10-12 MED FILL — LOSARTAN POTASSIUM 50 MG TA: 50 | 90 days supply | Qty: 90 | Fill #0

## 2019-10-26 ENCOUNTER — Other Ambulatory Visit (HOSPITAL_COMMUNITY): Payer: Self-pay | Admitting: Family Medicine

## 2019-10-26 MED FILL — METFORMIN HCL ER 500 MG TB2: 500 | 90 days supply | Qty: 360 | Fill #0

## 2019-11-03 DIAGNOSIS — R262 Difficulty in walking, not elsewhere classified: Secondary | ICD-10-CM | POA: Diagnosis not present

## 2019-11-03 DIAGNOSIS — E119 Type 2 diabetes mellitus without complications: Secondary | ICD-10-CM | POA: Diagnosis not present

## 2019-11-03 DIAGNOSIS — M542 Cervicalgia: Secondary | ICD-10-CM | POA: Diagnosis not present

## 2019-11-06 DIAGNOSIS — E119 Type 2 diabetes mellitus without complications: Secondary | ICD-10-CM | POA: Diagnosis not present

## 2019-11-06 DIAGNOSIS — R262 Difficulty in walking, not elsewhere classified: Secondary | ICD-10-CM | POA: Diagnosis not present

## 2019-11-06 DIAGNOSIS — M542 Cervicalgia: Secondary | ICD-10-CM | POA: Diagnosis not present

## 2019-11-14 MED FILL — GLIMEPIRIDE 4 MG TABLET: 4 | 30 days supply | Qty: 30 | Fill #2

## 2019-11-14 MED FILL — LORazepam 0.5 MG TABS: 0.5 | 30 days supply | Qty: 60 | Fill #1

## 2019-11-17 DIAGNOSIS — M542 Cervicalgia: Secondary | ICD-10-CM | POA: Diagnosis not present

## 2019-11-17 DIAGNOSIS — E119 Type 2 diabetes mellitus without complications: Secondary | ICD-10-CM | POA: Diagnosis not present

## 2019-11-17 DIAGNOSIS — R262 Difficulty in walking, not elsewhere classified: Secondary | ICD-10-CM | POA: Diagnosis not present

## 2019-12-01 ENCOUNTER — Other Ambulatory Visit (HOSPITAL_COMMUNITY): Payer: Self-pay | Admitting: Family Medicine

## 2019-12-01 DIAGNOSIS — M79671 Pain in right foot: Secondary | ICD-10-CM | POA: Diagnosis not present

## 2019-12-01 DIAGNOSIS — N400 Enlarged prostate without lower urinary tract symptoms: Secondary | ICD-10-CM | POA: Diagnosis not present

## 2019-12-01 DIAGNOSIS — M542 Cervicalgia: Secondary | ICD-10-CM | POA: Diagnosis not present

## 2019-12-01 DIAGNOSIS — E119 Type 2 diabetes mellitus without complications: Secondary | ICD-10-CM | POA: Diagnosis not present

## 2019-12-01 DIAGNOSIS — E7800 Pure hypercholesterolemia, unspecified: Secondary | ICD-10-CM | POA: Diagnosis not present

## 2019-12-01 DIAGNOSIS — F419 Anxiety disorder, unspecified: Secondary | ICD-10-CM | POA: Diagnosis not present

## 2019-12-01 DIAGNOSIS — Z23 Encounter for immunization: Secondary | ICD-10-CM | POA: Diagnosis not present

## 2019-12-01 DIAGNOSIS — I10 Essential (primary) hypertension: Secondary | ICD-10-CM | POA: Diagnosis not present

## 2019-12-01 MED FILL — MELOXICAM 15 MG TABLET: 15 | 14 days supply | Qty: 14 | Fill #0

## 2019-12-01 MED FILL — TAMSULOSIN HCL 0.4 MG CAP: 0.4 | 90 days supply | Qty: 90 | Fill #0

## 2019-12-04 DIAGNOSIS — E119 Type 2 diabetes mellitus without complications: Secondary | ICD-10-CM | POA: Diagnosis not present

## 2019-12-04 DIAGNOSIS — M542 Cervicalgia: Secondary | ICD-10-CM | POA: Diagnosis not present

## 2019-12-04 DIAGNOSIS — R262 Difficulty in walking, not elsewhere classified: Secondary | ICD-10-CM | POA: Diagnosis not present

## 2019-12-18 DIAGNOSIS — E119 Type 2 diabetes mellitus without complications: Secondary | ICD-10-CM | POA: Diagnosis not present

## 2019-12-18 DIAGNOSIS — M542 Cervicalgia: Secondary | ICD-10-CM | POA: Diagnosis not present

## 2019-12-18 DIAGNOSIS — R262 Difficulty in walking, not elsewhere classified: Secondary | ICD-10-CM | POA: Diagnosis not present

## 2020-01-15 MED FILL — LORazepam 0.5 MG TABS: 0.5 | 30 days supply | Qty: 60 | Fill #2

## 2020-01-15 MED FILL — SILDENAFIL CITRATE 100 MG T: 100 | 30 days supply | Qty: 6 | Fill #4

## 2020-01-15 MED FILL — LOSARTAN POTASSIUM 50 MG TA: 50 | 90 days supply | Qty: 90 | Fill #1

## 2020-02-02 MED FILL — METFORMIN HCL ER 500 MG TB2: 500 | 90 days supply | Qty: 360 | Fill #1

## 2020-03-07 ENCOUNTER — Other Ambulatory Visit (HOSPITAL_COMMUNITY): Payer: Self-pay | Admitting: Family Medicine

## 2020-03-07 MED FILL — LORazepam 0.5 MG TABS: 0.5 | 30 days supply | Qty: 60 | Fill #0

## 2020-03-12 ENCOUNTER — Other Ambulatory Visit (HOSPITAL_COMMUNITY): Payer: Self-pay | Admitting: Family Medicine

## 2020-03-13 MED FILL — SILDENAFIL CITRATE 100 MG T: 100 | 30 days supply | Qty: 6 | Fill #0

## 2020-03-21 MED FILL — GLIMEPIRIDE 4 MG TABLET: 4 | 30 days supply | Qty: 30 | Fill #3

## 2020-04-12 ENCOUNTER — Other Ambulatory Visit (HOSPITAL_COMMUNITY): Payer: Self-pay | Admitting: Family Medicine

## 2020-04-12 MED FILL — LORazepam 0.5 MG TABS: 0.5 | 30 days supply | Qty: 60 | Fill #1

## 2020-04-12 MED FILL — PRAVASTATIN SODIUM 80 MG TA: 80 | 90 days supply | Qty: 90 | Fill #0

## 2020-04-12 MED FILL — LOSARTAN POTASSIUM 50 MG TA: 50 | 30 days supply | Qty: 30 | Fill #0

## 2020-04-15 MED FILL — SILDENAFIL CITRATE 100 MG T: 100 | 30 days supply | Qty: 6 | Fill #1

## 2020-04-15 MED FILL — GLIMEPIRIDE 4 MG TABLET: 4 | 90 days supply | Qty: 90 | Fill #0

## 2020-04-15 MED FILL — METFORMIN HCL ER 500 MG TB2: 500 | 90 days supply | Qty: 360 | Fill #0

## 2020-04-15 MED FILL — FREESTYLE LITE TEST STRIP: 50 days supply | Qty: 100 | Fill #1

## 2020-04-16 ENCOUNTER — Other Ambulatory Visit (HOSPITAL_COMMUNITY): Payer: Self-pay | Admitting: Family Medicine

## 2020-04-16 MED FILL — TAMSULOSIN HCL 0.4 MG CAP: 0.4 | 90 days supply | Qty: 90 | Fill #1

## 2020-04-17 MED FILL — NAPROXEN SODIUM 550 MG TABS: 550 | 14 days supply | Qty: 28 | Fill #0

## 2020-07-01 ENCOUNTER — Other Ambulatory Visit (HOSPITAL_COMMUNITY): Payer: Self-pay

## 2020-07-01 MED ORDER — SILDENAFIL CITRATE 100 MG PO TABS
100.0000 mg | ORAL_TABLET | Freq: Every day | ORAL | 5 refills | Status: DC | PRN
  Filled 2020-07-01: qty 6, 6d supply, fill #0

## 2020-07-01 MED ORDER — LOSARTAN POTASSIUM 50 MG PO TABS
50.0000 mg | ORAL_TABLET | Freq: Every day | ORAL | 1 refills | Status: AC
  Filled 2020-07-01: qty 90, 90d supply, fill #0

## 2020-08-14 ENCOUNTER — Other Ambulatory Visit (HOSPITAL_COMMUNITY): Payer: Self-pay

## 2020-08-14 MED ORDER — GLIMEPIRIDE 4 MG PO TABS
4.0000 mg | ORAL_TABLET | Freq: Every day | ORAL | 0 refills | Status: AC
  Filled 2020-08-14: qty 90, 90d supply, fill #0

## 2020-08-22 ENCOUNTER — Other Ambulatory Visit (HOSPITAL_COMMUNITY): Payer: Self-pay

## 2021-07-25 DIAGNOSIS — I10 Essential (primary) hypertension: Secondary | ICD-10-CM | POA: Diagnosis not present

## 2021-07-25 DIAGNOSIS — E7800 Pure hypercholesterolemia, unspecified: Secondary | ICD-10-CM | POA: Diagnosis not present

## 2021-12-15 DIAGNOSIS — N3281 Overactive bladder: Secondary | ICD-10-CM | POA: Diagnosis not present

## 2021-12-15 DIAGNOSIS — Z0000 Encounter for general adult medical examination without abnormal findings: Secondary | ICD-10-CM | POA: Diagnosis not present

## 2021-12-15 DIAGNOSIS — I10 Essential (primary) hypertension: Secondary | ICD-10-CM | POA: Diagnosis not present

## 2021-12-15 DIAGNOSIS — E7800 Pure hypercholesterolemia, unspecified: Secondary | ICD-10-CM | POA: Diagnosis not present

## 2021-12-15 DIAGNOSIS — E119 Type 2 diabetes mellitus without complications: Secondary | ICD-10-CM | POA: Diagnosis not present

## 2021-12-15 DIAGNOSIS — M47812 Spondylosis without myelopathy or radiculopathy, cervical region: Secondary | ICD-10-CM | POA: Diagnosis not present

## 2021-12-15 DIAGNOSIS — N390 Urinary tract infection, site not specified: Secondary | ICD-10-CM | POA: Diagnosis not present

## 2021-12-15 DIAGNOSIS — K760 Fatty (change of) liver, not elsewhere classified: Secondary | ICD-10-CM | POA: Diagnosis not present

## 2021-12-15 DIAGNOSIS — Z23 Encounter for immunization: Secondary | ICD-10-CM | POA: Diagnosis not present

## 2021-12-15 DIAGNOSIS — R42 Dizziness and giddiness: Secondary | ICD-10-CM | POA: Diagnosis not present

## 2021-12-15 DIAGNOSIS — B351 Tinea unguium: Secondary | ICD-10-CM | POA: Diagnosis not present

## 2022-02-04 DIAGNOSIS — R300 Dysuria: Secondary | ICD-10-CM | POA: Diagnosis not present

## 2022-06-08 DIAGNOSIS — E1165 Type 2 diabetes mellitus with hyperglycemia: Secondary | ICD-10-CM | POA: Diagnosis not present

## 2022-06-08 DIAGNOSIS — E7800 Pure hypercholesterolemia, unspecified: Secondary | ICD-10-CM | POA: Diagnosis not present

## 2022-06-08 DIAGNOSIS — B351 Tinea unguium: Secondary | ICD-10-CM | POA: Diagnosis not present

## 2022-06-08 DIAGNOSIS — E119 Type 2 diabetes mellitus without complications: Secondary | ICD-10-CM | POA: Diagnosis not present

## 2022-06-08 DIAGNOSIS — N3281 Overactive bladder: Secondary | ICD-10-CM | POA: Diagnosis not present

## 2022-06-08 DIAGNOSIS — M7062 Trochanteric bursitis, left hip: Secondary | ICD-10-CM | POA: Diagnosis not present

## 2022-06-08 DIAGNOSIS — M47812 Spondylosis without myelopathy or radiculopathy, cervical region: Secondary | ICD-10-CM | POA: Diagnosis not present

## 2022-06-08 DIAGNOSIS — I10 Essential (primary) hypertension: Secondary | ICD-10-CM | POA: Diagnosis not present

## 2022-10-09 ENCOUNTER — Encounter: Payer: Self-pay | Admitting: Internal Medicine

## 2022-10-22 ENCOUNTER — Ambulatory Visit (AMBULATORY_SURGERY_CENTER): Payer: Commercial Managed Care - PPO | Admitting: *Deleted

## 2022-10-22 VITALS — Ht 73.0 in | Wt 222.0 lb

## 2022-10-22 DIAGNOSIS — Z86010 Personal history of colonic polyps: Secondary | ICD-10-CM

## 2022-10-22 MED ORDER — NA SULFATE-K SULFATE-MG SULF 17.5-3.13-1.6 GM/177ML PO SOLN: 1.0000 | Freq: Once | ORAL | 0 refills | Status: AC

## 2022-10-22 NOTE — Progress Notes (Signed)
Pt's name and DOB verified at the beginning of the pre-visit.  Pt denies any difficulty with ambulating,sitting, laying down or rolling side to side Gave both [facility] main # and MD on call # prior to instructions.  No egg or soy allergy known to patient  No issues known to pt with past sedation with any surgeries or procedures Pt denies having issues being intubated Pt has no issues moving head neck or swallowing No FH of Malignant Hyperthermia Pt is not on diet pills Pt is not on home 02  Pt is not on blood thinners  Pt denies issues with constipation  Pt is not on dialysis Pt denise any abnormal heart rhythms  Pt denies any upcoming cardiac testing Pt encouraged to use to use Singlecare or Goodrx to reduce cost  Patient's chart reviewed by Miguel Estrada CNRA prior to pre-visit and patient appropriate for the [facility].  Pre-visit completed and red dot placed by patient's name on their procedure day (on provider's schedule).  . Visit by phone Pt states weight is 222 lb Instructed pt why it is important to and  to call if they have any changes in health or new medications. Directed them to the # given and on instructions.   Pt states they will.  Instructions reviewed with pt and pt states understanding. Instructed to review again prior to procedure. Pt states they will.  Instructions sent by mail

## 2022-11-19 ENCOUNTER — Encounter: Payer: Self-pay | Admitting: Internal Medicine

## 2022-11-20 ENCOUNTER — Telehealth: Payer: Self-pay | Admitting: Internal Medicine

## 2022-11-20 NOTE — Telephone Encounter (Signed)
PT is calling to find out if he can still take Lorazepam as well as a muscle relaxer while prepping for colonoscopy 10/10. Please advise.

## 2022-11-20 NOTE — Telephone Encounter (Signed)
Left message for pt to call back.  Discussed with pt that he should hold the medication until after the procedure as he will be sedated for the procedure.

## 2022-11-26 ENCOUNTER — Ambulatory Visit: Payer: Medicare Other | Admitting: Internal Medicine

## 2022-11-26 ENCOUNTER — Encounter: Payer: Self-pay | Admitting: Internal Medicine

## 2022-11-26 VITALS — BP 123/81 | HR 75 | Temp 97.5°F | Resp 10 | Ht 73.0 in | Wt 227.0 lb

## 2022-11-26 DIAGNOSIS — E119 Type 2 diabetes mellitus without complications: Secondary | ICD-10-CM | POA: Diagnosis not present

## 2022-11-26 DIAGNOSIS — Z860100 Personal history of colon polyps, unspecified: Secondary | ICD-10-CM | POA: Diagnosis not present

## 2022-11-26 DIAGNOSIS — D122 Benign neoplasm of ascending colon: Secondary | ICD-10-CM

## 2022-11-26 DIAGNOSIS — D124 Benign neoplasm of descending colon: Secondary | ICD-10-CM

## 2022-11-26 DIAGNOSIS — I10 Essential (primary) hypertension: Secondary | ICD-10-CM | POA: Diagnosis not present

## 2022-11-26 DIAGNOSIS — Z09 Encounter for follow-up examination after completed treatment for conditions other than malignant neoplasm: Secondary | ICD-10-CM | POA: Diagnosis not present

## 2022-11-26 DIAGNOSIS — E785 Hyperlipidemia, unspecified: Secondary | ICD-10-CM | POA: Diagnosis not present

## 2022-11-26 MED ORDER — SODIUM CHLORIDE 0.9 % IV SOLN: 500.0000 mL | Freq: Once | INTRAVENOUS | Status: DC

## 2022-11-26 NOTE — Progress Notes (Signed)
Vss nad trans to pacu 

## 2022-11-26 NOTE — Progress Notes (Signed)
GASTROENTEROLOGY PROCEDURE H&P NOTE   Primary Care Physician: Noberto Retort, MD    Reason for Procedure:  History of adenomatous colon polyps  Plan:    Colonoscopy  Patient is appropriate for endoscopic procedure(s) in the ambulatory ([facility]) setting.  The nature of the procedure, as well as the risks, benefits, and alternatives were carefully and thoroughly reviewed with the patient. Ample time for discussion and questions allowed. The patient understood, was satisfied, and agreed to proceed.     HPI: Miguel Estrada is a 67 y.o. male who presents for surveillance colonoscopy.  Medical history as below.  Tolerated the prep.  No recent chest pain or shortness of breath.  No abdominal pain today.  Past Medical History:  Diagnosis Date   Anxiety    Arthritis    neck   Cataract    left eye   Diabetes mellitus without complication (HCC)    Hyperlipidemia    Hypertension     Past Surgical History:  Procedure Laterality Date   COLONOSCOPY  11-05-2006   Dr.Buccini-normal colon   POLYPECTOMY     TONSILLECTOMY  age 38    Prior to Admission medications   Medication Sig Start Date End Date Taking? Authorizing Provider  acetaminophen (TYLENOL) 650 MG CR tablet Take 650 mg by mouth every 8 (eight) hours as needed for pain. Patient not taking: Reported on 10/22/2022    [provider]  gabapentin (NEURONTIN) 300 MG capsule Take 300 mg by mouth at bedtime. 07/20/22   [provider]  glimepiride (AMARYL) 4 MG tablet TAKE 1 TABLET BY MOUTH DAILY WITH BREAKFAST OR THE FIRST MAIN MEAL OF THE DAY 04/12/20 04/12/21  Noberto Retort, MD  glimepiride (AMARYL) 4 MG tablet TAKE 1 TABLET BY MOUTH DAILY WITH BREAKFAST OR THE FIRST MAIN MEAL OF THE DAY 06/09/19 06/08/20  Noberto Retort, MD  glimepiride (AMARYL) 4 MG tablet Take 1 tablet (4 mg total) by mouth daily with breakfast or the first main meal of the day. 08/13/20     LORazepam (ATIVAN) 0.5 MG tablet Take 0.5 mg by  mouth every 8 (eight) hours as needed for anxiety.     [provider]  losartan (COZAAR) 50 MG tablet Take 50 mg by mouth daily.    [provider]  losartan (COZAAR) 50 MG tablet TAKE 1 TABLET BY MOUTH ONCE DAILY. 04/12/20 04/12/21  Noberto Retort, MD  losartan (COZAAR) 50 MG tablet TAKE 1 TABLET BY MOUTH ONCE A DAY 10/12/19 10/11/20  Noberto Retort, MD  losartan (COZAAR) 50 MG tablet Take 1 tablet (50 mg total) by mouth daily. 07/01/20     metFORMIN (GLUCOPHAGE-XR) 500 MG 24 hr tablet Take 1,000 mg by mouth every evening. 10/08/16   [provider]  metFORMIN (GLUCOPHAGE-XR) 500 MG 24 hr tablet TAKE 2 TABLETS BY MOUTH TWO TIMES DAILY 04/12/20 04/12/21  Noberto Retort, MD  metFORMIN (GLUCOPHAGE-XR) 500 MG 24 hr tablet TAKE 2 TABLETS BY MOUTH TWO TIMES DAILY 10/26/19 10/25/20  Noberto Retort, MD  Multiple Vitamin (MULTIVITAMIN) capsule Take 1 capsule by mouth daily. Centrum for Men    [provider]  pravastatin (PRAVACHOL) 80 MG tablet Take 80 mg by mouth daily.    [provider]  pravastatin (PRAVACHOL) 80 MG tablet TAKE 1 TABLET BY MOUTH ONCE A DAY 04/12/20 04/12/21  Noberto Retort, MD  sildenafil (VIAGRA) 100 MG tablet Take 100 mg by mouth daily as needed for erectile dysfunction. 10/08/16  [provider]  sildenafil (VIAGRA) 100 MG tablet TAKE 1 TABLET BY MOUTH ONCE DAILY AS NEEDED. 03/12/20 03/12/21  Noberto Retort, MD  tamsulosin (FLOMAX) 0.4 MG CAPS capsule Take 0.4 mg by mouth daily after supper.    [provider]    Current Outpatient Medications  Medication Sig Dispense Refill   acetaminophen (TYLENOL) 650 MG CR tablet Take 650 mg by mouth every 8 (eight) hours as needed for pain. (Patient not taking: Reported on 10/22/2022)     gabapentin (NEURONTIN) 300 MG capsule Take 300 mg by mouth at bedtime.     glimepiride (AMARYL) 4 MG tablet TAKE 1 TABLET BY MOUTH DAILY WITH BREAKFAST OR THE FIRST MAIN MEAL OF THE DAY 90  tablet 0   glimepiride (AMARYL) 4 MG tablet TAKE 1 TABLET BY MOUTH DAILY WITH BREAKFAST OR THE FIRST MAIN MEAL OF THE DAY 30 tablet 6   glimepiride (AMARYL) 4 MG tablet Take 1 tablet (4 mg total) by mouth daily with breakfast or the first main meal of the day. 90 tablet 0   LORazepam (ATIVAN) 0.5 MG tablet Take 0.5 mg by mouth every 8 (eight) hours as needed for anxiety.      losartan (COZAAR) 50 MG tablet Take 50 mg by mouth daily.     losartan (COZAAR) 50 MG tablet TAKE 1 TABLET BY MOUTH ONCE DAILY. 90 tablet 0   losartan (COZAAR) 50 MG tablet TAKE 1 TABLET BY MOUTH ONCE A DAY 90 tablet 1   losartan (COZAAR) 50 MG tablet Take 1 tablet (50 mg total) by mouth daily. 90 tablet 1   metFORMIN (GLUCOPHAGE-XR) 500 MG 24 hr tablet Take 1,000 mg by mouth every evening.  0   metFORMIN (GLUCOPHAGE-XR) 500 MG 24 hr tablet TAKE 2 TABLETS BY MOUTH TWO TIMES DAILY 360 tablet 0   metFORMIN (GLUCOPHAGE-XR) 500 MG 24 hr tablet TAKE 2 TABLETS BY MOUTH TWO TIMES DAILY 360 tablet 1   Multiple Vitamin (MULTIVITAMIN) capsule Take 1 capsule by mouth daily. Centrum for Men     pravastatin (PRAVACHOL) 80 MG tablet Take 80 mg by mouth daily.     pravastatin (PRAVACHOL) 80 MG tablet TAKE 1 TABLET BY MOUTH ONCE A DAY 90 tablet 0   sildenafil (VIAGRA) 100 MG tablet Take 100 mg by mouth daily as needed for erectile dysfunction.  3   sildenafil (VIAGRA) 100 MG tablet TAKE 1 TABLET BY MOUTH ONCE DAILY AS NEEDED. 6 tablet 3   tamsulosin (FLOMAX) 0.4 MG CAPS capsule Take 0.4 mg by mouth daily after supper.     Current Facility-Administered Medications  Medication Dose Route Frequency Provider Last Rate Last Admin   0.9 %  sodium chloride infusion  500 mL Intravenous Once Bobby Barton, Carie Caddy, MD        Allergies as of 11/26/2022 - Review Complete 11/26/2022  Allergen Reaction Noted   Penicillins Other (See Comments) 12/23/2011    Family History  Problem Relation Age of Onset   Colon polyps Brother    Pancreatic cancer  Brother    Colon cancer Neg Hx    Esophageal cancer Neg Hx    Rectal cancer Neg Hx    Stomach cancer Neg Hx     Social History   Socioeconomic History   Marital status: Married    Spouse name: Not on file   Number of children: Not on file   Years of education: Not on file   Highest education level: Not on file  Occupational History  Not on file  Tobacco Use   Smoking status: Former   Smokeless tobacco: Never   Tobacco comments:    quit smoking 30 years ago  Substance and Sexual Activity   Alcohol use: Yes    Alcohol/week: 2.0 standard drinks of alcohol    Types: 2 Cans of beer per week   Drug use: No   Sexual activity: Not on file  Other Topics Concern   Not on file  Social History Narrative   Not on file   Social Determinants of Health   Financial Resource Strain: Not on file  Food Insecurity: Not on file  Transportation Needs: Not on file  Physical Activity: Not on file  Stress: Not on file  Social Connections: Not on file  Intimate Partner Violence: Not on file    Physical Exam: Vital signs in last 24 hours: @BP  139/78   Pulse 97   Temp (!) 97.5 F (36.4 C) (Temporal)   Ht 6\' 1"  (1.854 m)   Wt 227 lb (103 kg)   SpO2 98%   BMI 29.95 kg/m  GEN: NAD EYE: Sclerae anicteric ENT: MMM CV: Non-tachycardic Pulm: CTA b/l GI: Soft, NT/ND NEURO:  Alert & Oriented x 3   Erick Blinks, MD [facility]  11/26/2022 2:51 PM

## 2022-11-26 NOTE — Progress Notes (Signed)
Pt's states no medical or surgical changes since previsit or office visit. 

## 2022-11-26 NOTE — Patient Instructions (Addendum)
-   Patient given educational handouts related to procedure. - Resume previous diet. - Continue present medications. - Await pathology results. - Repeat colonoscopy is recommended for surveillance. The colonoscopy date will be determined after pathology results from today's exam become available for review.  YOU HAD AN ENDOSCOPIC PROCEDURE TODAY AT THE [facility]:   Refer to the procedure report that was given to you for any specific questions about what was found during the examination.  If the procedure report does not answer your questions, please call your gastroenterologist to clarify.  If you requested that your care partner not be given the details of your procedure findings, then the procedure report has been included in a sealed envelope for you to review at your convenience later.  YOU SHOULD EXPECT: Some feelings of bloating in the abdomen. Passage of more gas than usual.  Walking can help get rid of the air that was put into your GI tract during the procedure and reduce the bloating. If you had a lower endoscopy (such as a colonoscopy or flexible sigmoidoscopy) you may notice spotting of blood in your stool or on the toilet paper. If you underwent a bowel prep for your procedure, you may not have a normal bowel movement for a few days.  Please Note:  You might notice some irritation and congestion in your nose or some drainage.  This is from the oxygen used during your procedure.  There is no need for concern and it should clear up in a day or so.  SYMPTOMS TO REPORT IMMEDIATELY:  Following lower endoscopy (colonoscopy or flexible sigmoidoscopy):  Excessive amounts of blood in the stool  Significant tenderness or worsening of abdominal pains  Swelling of the abdomen that is new, acute  Fever of 100F or higher  For urgent or emergent issues, a gastroenterologist can be reached at any hour by calling [phone]. Do not use MyChart messaging for urgent concerns.     DIET:  We do recommend a small meal at first, but then you may proceed to your regular diet.  Drink plenty of fluids but you should avoid alcoholic beverages for 24 hours.  ACTIVITY:  You should plan to take it easy for the rest of today and you should NOT DRIVE or use heavy machinery until tomorrow (because of the sedation medicines used during the test).    FOLLOW UP: Our staff will call the number listed on your records the next business day following your procedure.  We will call around 7:15- 8:00 am to check on you and address any questions or concerns that you may have regarding the information given to you following your procedure. If we do not reach you, we will leave a message.     If any biopsies were taken you will be contacted by phone or by letter within the next 1-3 weeks.  Please call us at [phone] if you have not heard about the biopsies in 3 weeks.    SIGNATURES/CONFIDENTIALITY: You and/or your care partner have signed paperwork which will be entered into your electronic medical record.  These signatures attest to the fact that that the information above on your After Visit Summary has been reviewed and is understood.  Full responsibility of the confidentiality of this discharge information lies with you and/or your care-partner.

## 2022-11-26 NOTE — Op Note (Signed)
[facility] Patient Name: Miguel Estrada Procedure Date: 11/26/2022 3:04 PM MRN: [id number] Endoscopist: Beverley Fiedler , MD, [id number] Age: 67 Referring MD:  Date of Birth: 12/10/1955 Gender: Male Account #: [id number] Procedure:                Colonoscopy Indications:              High risk colon cancer surveillance: Personal                            history of non-advanced adenomas, Last colonoscopy:                            September 2019 (TA x 2) Medicines:                Monitored Anesthesia Care Procedure:                Pre-Anesthesia Assessment:                           - Prior to the procedure, a History and Physical                            was performed, and patient medications and                            allergies were reviewed. The patient's tolerance of                            previous anesthesia was also reviewed. The risks                            and benefits of the procedure and the sedation                            options and risks were discussed with the patient.                            All questions were answered, and informed consent                            was obtained. Prior Anticoagulants: The patient has                            taken no anticoagulant or antiplatelet agents. ASA                            Grade Assessment: III - A patient with severe                            systemic disease. After reviewing the risks and                            benefits, the patient was deemed in satisfactory  condition to undergo the procedure.                           After obtaining informed consent, the colonoscope                            was passed under direct vision. Throughout the                            procedure, the patient's blood pressure, pulse, and                            oxygen saturations were monitored continuously. The                            CF [id number] #[id number] was introduced  through the anus                            and advanced to the cecum, identified by                            appendiceal orifice and ileocecal valve. The                            colonoscopy was performed without difficulty. The                            patient tolerated the procedure well. The quality                            of the bowel preparation was good. The ileocecal                            valve, appendiceal orifice, and rectum were                            photographed. Scope In: 3:10:03 PM Scope Out: 3:23:38 PM Scope Withdrawal Time: 0 hours 11 minutes 27 seconds  Total Procedure Duration: 0 hours 13 minutes 35 seconds  Findings:                 The digital rectal exam was normal.                           Two sessile polyps were found in the ascending                            colon. The polyps were 4 to 5 mm in size. These                            polyps were removed with a cold snare. Resection                            and retrieval were complete.  A 6 mm polyp was found in the descending colon. The                            polyp was sessile. The polyp was removed with a                            cold snare. Resection and retrieval were complete.                           A single medium-sized angioectasia with typical                            arborization was found in the cecum.                           Multiple medium-mouthed and small-mouthed                            diverticula were found in the sigmoid colon.                           Internal hemorrhoids were found during                            retroflexion. The hemorrhoids were medium-sized. Complications:            No immediate complications. Estimated Blood Loss:     Estimated blood loss: none. Impression:               - Two 4 to 5 mm polyps in the ascending colon,                            removed with a cold snare. Resected and retrieved.                            - One 6 mm polyp in the descending colon, removed                            with a cold snare. Resected and retrieved.                           - A single colonic angioectasia.                           - Mild diverticulosis in the sigmoid colon.                           - Internal hemorrhoids. Recommendation:           - Patient has a contact number available for                            emergencies. The signs and symptoms of potential  delayed complications were discussed with the                            patient. Return to normal activities tomorrow.                            Written discharge instructions were provided to the                            patient.                           - Resume previous diet.                           - Continue present medications.                           - Await pathology results.                           - Repeat colonoscopy is recommended for                            surveillance. The colonoscopy date will be                            determined after pathology results from today's                            exam become available for review. Beverley Fiedler, MD 11/26/2022 3:26:13 PM This report has been signed electronically.

## 2022-11-26 NOTE — Progress Notes (Signed)
Called to room to assist during endoscopic procedure.  Patient ID and intended procedure confirmed with present staff. Received instructions for my participation in the procedure from the performing physician.  

## 2022-11-27 ENCOUNTER — Telehealth: Payer: Self-pay

## 2022-11-27 NOTE — Telephone Encounter (Signed)
  Follow up Call-     11/26/2022    2:47 PM  Call back number  Post procedure Call Back phone  # [phone]  Permission to leave phone message Yes     Patient questions:  Do you have a fever, pain , or abdominal swelling? No. Pain Score  0 *  Have you tolerated food without any problems? Yes.    Have you been able to return to your normal activities? Yes.    Do you have any questions about your discharge instructions: Diet   No. Medications  No. Follow up visit  No.  Do you have questions or concerns about your Care? No.  Actions: * If pain score is 4 or above: No action needed, pain <4.

## 2022-12-01 LAB — SURGICAL PATHOLOGY

## 2022-12-02 ENCOUNTER — Encounter: Payer: Self-pay | Admitting: Internal Medicine

## 2022-12-21 DIAGNOSIS — E7800 Pure hypercholesterolemia, unspecified: Secondary | ICD-10-CM | POA: Diagnosis not present

## 2022-12-21 DIAGNOSIS — I10 Essential (primary) hypertension: Secondary | ICD-10-CM | POA: Diagnosis not present

## 2022-12-21 DIAGNOSIS — E119 Type 2 diabetes mellitus without complications: Secondary | ICD-10-CM | POA: Diagnosis not present

## 2022-12-24 DIAGNOSIS — M47812 Spondylosis without myelopathy or radiculopathy, cervical region: Secondary | ICD-10-CM | POA: Diagnosis not present

## 2022-12-24 DIAGNOSIS — E1165 Type 2 diabetes mellitus with hyperglycemia: Secondary | ICD-10-CM | POA: Diagnosis not present

## 2022-12-24 DIAGNOSIS — E7800 Pure hypercholesterolemia, unspecified: Secondary | ICD-10-CM | POA: Diagnosis not present

## 2022-12-24 DIAGNOSIS — I10 Essential (primary) hypertension: Secondary | ICD-10-CM | POA: Diagnosis not present

## 2022-12-24 DIAGNOSIS — N3000 Acute cystitis without hematuria: Secondary | ICD-10-CM | POA: Diagnosis not present

## 2022-12-24 DIAGNOSIS — M7062 Trochanteric bursitis, left hip: Secondary | ICD-10-CM | POA: Diagnosis not present

## 2022-12-24 DIAGNOSIS — Z23 Encounter for immunization: Secondary | ICD-10-CM | POA: Diagnosis not present

## 2022-12-24 DIAGNOSIS — N3281 Overactive bladder: Secondary | ICD-10-CM | POA: Diagnosis not present

## 2022-12-24 DIAGNOSIS — Z0000 Encounter for general adult medical examination without abnormal findings: Secondary | ICD-10-CM | POA: Diagnosis not present

## 2023-06-30 DIAGNOSIS — Z23 Encounter for immunization: Secondary | ICD-10-CM | POA: Diagnosis not present

## 2023-06-30 DIAGNOSIS — M542 Cervicalgia: Secondary | ICD-10-CM | POA: Diagnosis not present

## 2023-06-30 DIAGNOSIS — E7800 Pure hypercholesterolemia, unspecified: Secondary | ICD-10-CM | POA: Diagnosis not present

## 2023-06-30 DIAGNOSIS — I10 Essential (primary) hypertension: Secondary | ICD-10-CM | POA: Diagnosis not present

## 2023-06-30 DIAGNOSIS — E119 Type 2 diabetes mellitus without complications: Secondary | ICD-10-CM | POA: Diagnosis not present

## 2023-06-30 DIAGNOSIS — N3281 Overactive bladder: Secondary | ICD-10-CM | POA: Diagnosis not present

## 2023-08-19 ENCOUNTER — Ambulatory Visit: Attending: Family Medicine | Admitting: Audiology

## 2023-08-19 DIAGNOSIS — H9312 Tinnitus, left ear: Secondary | ICD-10-CM | POA: Insufficient documentation

## 2023-08-19 DIAGNOSIS — H903 Sensorineural hearing loss, bilateral: Secondary | ICD-10-CM | POA: Diagnosis not present

## 2023-08-19 NOTE — Procedures (Signed)
  [facility]patient [facility] [address] [place], [place]  [address] [phone]  AUDIOLOGICAL  EVALUATION  NAME: Miguel Estrada     DOB:   Jul 25, 1955      MRN: [id number]                                                                                     DATE: 08/19/2023     REFERENT: Arloa Elsie SAUNDERS, MD STATUS: Outpatient DIAGNOSIS: sensorineural hearing loss, tinnitus   History: [name] was seen for an audiological evaluation due to left tinnitus occurring for 2-3 months. [name] reports a low-pitched, humming tinnitus occurring everyday and he reports is constant. He reports sometimes the tinnitus worsens in intensity and becomes louder. Less reports the left tinnitus can be very annoying and bothersome. [name] reports left aural fullness occurring for 2-3 months. He reports he feels as if his left ear if stopped up. He denies otalgia and dizziness at today's appointment. [name] denies concerns regarding his hearing sensitivity.   Evaluation:  Otoscopy showed a clear view of the tympanic membranes, bilaterally Tympanometry results were consistent in the right ear with normal tympanic membrane mobility and normal middle ear pressure (Type A) and consistent in the left ear with no tympanic membrane mobility and middle ear dysfunction (Type B).  Audiometric testing was completed using Conventional Audiometry techniques with insert earphones and TDH headphones. Test results are consistent with normal hearing sensitivity sloping to a mild to moderate sensorineural hearing loss, bilaterally. Speech Recognition Thresholds were obtained at 30 dB HL in the right ear and at 25  dB HL in the left ear. Word Recognition Testing was completed at 70 dB HL and Bryann scored 100%, bilaterally.    Results:  The test results were reviewed with [name]. Testing from tympanometry shows normal tympanic membrane mobility in the right ear and no tympanic membrane mobility in the left  ear. Audiometric Test results are consistent with normal hearing sensitivity sloping to a mild to moderate sensorineural hearing loss, bilaterally. [name] will have hearing and communication difficulty in many listening environments. He will benefit from the use of good communication strategies and the use of hearing aids if motivated. [name] was counseled regarding his tinnitus and tinnitus management strategies. An evaluation by an ENT was recommended for left sided tinnitus. [name] was given a copy of his audiogram today and given handouts on tinnitus management strategies.   Recommendations: 1.   Evaluation by an Ear, Nose, and Throat Physician due to left sided tinnitus and left middle ear dysfunction.  2.   Evaluation by the [facility] if tinnitus worsens.  3.   Monitor hearing sensitivity 4.   Use of hearing aids if motivated   40 minutes spent testing and counseling on results.   If you have any questions please feel free to contact me at [phone].  Darryle Posey Audiologist, Au.D., CCC-A 08/19/2023  12:01 PM  Cc: Arloa Elsie SAUNDERS, MD

## 2023-08-25 DIAGNOSIS — H903 Sensorineural hearing loss, bilateral: Secondary | ICD-10-CM | POA: Diagnosis not present

## 2023-08-25 DIAGNOSIS — H9312 Tinnitus, left ear: Secondary | ICD-10-CM | POA: Diagnosis not present

## 2023-08-25 NOTE — Progress Notes (Signed)
" °  Subjective  Patient ID: Miguel Estrada is a 68 y.o. male being seen for:  Chief Complaint  Patient presents with   Hearing Loss     HPI   68 year old male with left-sided tinnitus and had outside audiometric testing at [facility] which showed a mild mid to high-frequency bilateral sensorineural hearing loss.  The right tympanogram is normal in the left was somewhat flat.  He describes some very mild plugging in the left ear.  Review of Systems: all relevant systems have been reviewed unless otherwise documented.  Medical History[1]  Surgical History[2]  Family History[3]  Allergies[4]   Objective  Physical Exam: General/Constitutional: Patient is a well-nourished, well-developed in no distress. Answers questions appropriately.  Skin/scalp : Normal and without lesions. No rashes, ulcerations or masses noted.  Head: No facial deformities  Eyes: Vision grossly intact. Normal extraocular movements. No nystagmus noted.  Ears: Right ear: Normal ear canal. Normal tympanic membrane. Left ear: Normal ear canal. Normal tympanic membrane.  Nose: Septum midline, turbinates slightly enlarged  Oral cavity and oropharynx: No concerning lesions in the oral cavity or pharynx.  Neck: No palpable masses or lesions    Assessment/Plan  1. Tinnitus of left ear (Primary) - We discussed the nature of tinnitus, masking techniques, use of hearing aids and potential medical therapy for severe and refractory symptoms.  2. Bilateral sensorineural hearing loss - Outside audiometric testing was independently reviewed and was consistent with a mild high-frequency sensorineural hearing loss.  Tympanograms were also reviewed and although there was some flatness to the left tympanogram I do not see any corresponding middle ear fluid which would justify a tube.  We discussed the potential role of hearing aids however he feels that his hearing is doing overall okay.   No orders of the defined types were  placed in this encounter.   No follow-ups on file.   Electronically signed by: Arthea Fries, MD 08/25/2023 10:46 AM       [1] Past Medical History: Diagnosis Date   Diabetes    (CMD)    Hyperlipemia    Hypertension   [2] History reviewed. No pertinent surgical history. [3] No family history on file. [4] Allergies Allergen Reactions   Penicillins Other (See Comments)    As child=reaction unknown, Has patient had a PCN reaction causing immediate rash, facial/tongue/throat swelling, SOB or lightheadedness with hypotension: UNK, Has patient had a PCN reaction causing severe rash involving mucus membranes or skin necrosis: UNK, Has patient had a PCN reaction that required hospitalization: UNK, Has patient had a PCN reaction occurring within the last 10 years: No, If all of the above answers are NO, then may proceed with Cephalosporin use.  "

## 2023-10-15 DIAGNOSIS — E119 Type 2 diabetes mellitus without complications: Secondary | ICD-10-CM | POA: Diagnosis not present
